# Patient Record
Sex: Male | Born: 1995
Health system: Southern US, Community
[De-identification: ages and names within clinical notes are randomized; demographics above are authoritative.]

## PROBLEM LIST (undated history)

## (undated) DIAGNOSIS — F32A Depression, unspecified: Secondary | ICD-10-CM

## (undated) DIAGNOSIS — F419 Anxiety disorder, unspecified: Secondary | ICD-10-CM

## (undated) DIAGNOSIS — J302 Other seasonal allergic rhinitis: Secondary | ICD-10-CM

## (undated) HISTORY — PX: MYRINGOTOMY: SUR874

## (undated) HISTORY — PX: WISDOM TOOTH EXTRACTION: SHX21

---
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
## 2016-12-03 ENCOUNTER — Emergency Department
Admission: EM | Admit: 2016-12-03 | Discharge: 2016-12-03 | Disposition: A | Discharge status: Home / Self Care | Payer: 59 | Source: Home / Self Care | Attending: Family Medicine | Admitting: Family Medicine

## 2016-12-03 ENCOUNTER — Encounter: Payer: Self-pay | Admitting: Emergency Medicine

## 2016-12-03 DIAGNOSIS — H938X3 Other specified disorders of ear, bilateral: Secondary | ICD-10-CM

## 2016-12-03 DIAGNOSIS — J069 Acute upper respiratory infection, unspecified: Secondary | ICD-10-CM

## 2016-12-03 DIAGNOSIS — B9789 Other viral agents as the cause of diseases classified elsewhere: Secondary | ICD-10-CM

## 2016-12-03 MED ORDER — AMOXICILLIN 500 MG PO CAPS: 500.0000 mg | ORAL_CAPSULE | Freq: Three times a day (TID) | ORAL | 0 refills | Status: DC

## 2016-12-03 NOTE — ED Provider Notes (Signed)
[facility]    CSN: [id number] Arrival date & time: 12/03/16  1433     History   Chief Complaint Chief Complaint  Patient presents with  . Sinus Problem    HPI Steven Chan is a 21 y.o. male.   HPI  Steven SavoySeth M Steven Chan is a 21 y.o. male presenting to UC with c/o 10 days of mild to moderate sinus congestion and facial pressure, popping in his ears and Right ear pain. Mild intermittent non-productive cough. His parents have been sick with similar symptoms recently. Denies fever, chills, n/v/d.    History reviewed. No pertinent past medical history.  There are no active problems to display for this patient.   Past Surgical History:  Procedure Laterality Date  . MYRINGOTOMY    . WISDOM TOOTH EXTRACTION         Home Medications    Prior to Admission medications   Medication Sig Start Date End Date Taking? Authorizing Provider  amoxicillin (AMOXIL) 500 MG capsule Take 1 capsule (500 mg total) by mouth 3 (three) times daily. 12/03/16   Lurene ShadowPhelps, Solaris Kram O, PA-C    Family History Family History  Problem Relation Age of Onset  . Multiple sclerosis Mother     Social History Social History  Substance Use Topics  . Smoking status: Never Smoker  . Smokeless tobacco: Never Used  . Alcohol use No     Allergies   Patient has no known allergies.   Review of Systems Review of Systems  Constitutional: Negative for chills and fever.  HENT: Positive for congestion, ear pain (Right worse than Left), postnasal drip, rhinorrhea and sinus pressure. Negative for sore throat, trouble swallowing and voice change.   Respiratory: Positive for cough. Negative for shortness of breath.   Cardiovascular: Negative for chest pain and palpitations.  Gastrointestinal: Negative for abdominal pain, diarrhea, nausea and vomiting.  Musculoskeletal: Negative for arthralgias, back pain and myalgias.  Skin: Negative for rash.     Physical Exam Triage Vital Signs ED Triage Vitals  [12/03/16 1448]  Enc Vitals Group     BP (!) 142/89     Pulse Rate (!) 111     Resp      Temp 97.7 F (36.5 C)     Temp Source Oral     SpO2 100 %     Weight 147 lb (66.7 kg)     Height 6\' 1"  (1.854 m)     Head Circumference      Peak Flow      Pain Score 2     Pain Loc      Pain Edu?      Excl. in GC?    No data found.   Updated Vital Signs BP (!) 142/89 (BP Location: Left Arm)   Pulse (!) 111   Temp 97.7 F (36.5 C) (Oral)   Ht 6\' 1"  (1.854 m)   Wt 147 lb (66.7 kg)   SpO2 100%   BMI 19.39 kg/m   Visual Acuity Right Eye Distance:   Left Eye Distance:   Bilateral Distance:    Right Eye Near:   Left Eye Near:    Bilateral Near:     Physical Exam  Constitutional: He is oriented to person, place, and time. He appears well-developed and well-nourished. No distress.  HENT:  Head: Normocephalic and atraumatic.  Right Ear: Tympanic membrane is not erythematous and not bulging. A middle ear effusion is present.  Left Ear: Tympanic membrane is not erythematous  and not bulging. A middle ear effusion is present.  Nose: Mucosal edema present. Right sinus exhibits no maxillary sinus tenderness and no frontal sinus tenderness. Left sinus exhibits no maxillary sinus tenderness and no frontal sinus tenderness.  Mouth/Throat: Uvula is midline, oropharynx is clear and moist and mucous membranes are normal.  Eyes: EOM are normal.  Neck: Normal range of motion. Neck supple.  Cardiovascular: Normal rate and regular rhythm.   Pulmonary/Chest: Effort normal and breath sounds normal. No stridor. No respiratory distress. He has no wheezes. He has no rales.  Musculoskeletal: Normal range of motion.  Lymphadenopathy:    He has no cervical adenopathy.  Neurological: He is alert and oriented to person, place, and time.  Skin: Skin is warm and dry. He is not diaphoretic.  Psychiatric: He has a normal mood and affect. His behavior is normal.  Nursing note and vitals reviewed.    UC  Treatments / Results  Labs (all labs ordered are listed, but only abnormal results are displayed) Labs Reviewed - No data to display  EKG  EKG Interpretation None       Radiology No results found.  Procedures Procedures (including critical care time)  Medications Ordered in UC Medications - No data to display   Initial Impression / Assessment and Plan / UC Course  I have reviewed the triage vital signs and the nursing notes.  Pertinent labs & imaging results that were available during my care of the patient were reviewed by me and considered in my medical decision making (see chart for details).     Tympanometry: Left ear- positive peak pressure   Right ear- Normal  Final Clinical Impressions(s) / UC Diagnoses   Final diagnoses:  Viral upper respiratory tract infection with cough  Ear fullness, bilateral   Hx and exam c/w viral URI, encouraged symptomatic treatment. Prescription to hold with expiration date for amoxicillin. Pt to fill if persistent fever develops, if ear pain worsens or not improving in 1 week.    New Prescriptions Discharge Medication List as of 12/03/2016  2:59 PM    START taking these medications   Details  amoxicillin (AMOXIL) 500 MG capsule Take 1 capsule (500 mg total) by mouth 3 (three) times daily., Starting Fri 12/03/2016, Print         Controlled Substance Prescriptions [place] Controlled Substance Registry consulted? Not Applicable   Rolla Plate 12/03/16 1524

## 2016-12-03 NOTE — ED Triage Notes (Signed)
Sinus pain, pressure, congestion, ears popping, rt ear pain x 10 days

## 2016-12-03 NOTE — Discharge Instructions (Signed)
°  You may take 500mg  acetaminophen every 4-6 hours or in combination with ibuprofen 400-600mg  every 6-8 hours as needed for pain, inflammation, and fever.  Be sure to drink at least eight 8oz glasses of water to stay well hydrated and get at least 8 hours of sleep at night, preferably more while sick.   Your symptoms are likely due to a virus such as the common cold, however, if you developing worsening chest congestion with shortness of breath, persistent fever (>100.4*F) for 3 days, worsening ear pain, or symptoms not improving in 4-5 days, you may fill the antibiotic (amoxicillin.  If you do fill the antibiotic,  please take antibiotics as prescribed and be sure to complete entire course even if you start to feel better to ensure infection does not come back.

## 2017-06-04 ENCOUNTER — Emergency Department
Admission: EM | Admit: 2017-06-04 | Discharge: 2017-06-04 | Discharge status: Absent without leave | Payer: 59 | Source: Home / Self Care

## 2017-08-19 ENCOUNTER — Other Ambulatory Visit: Payer: Self-pay | Admitting: Family Medicine

## 2017-08-19 DIAGNOSIS — R590 Localized enlarged lymph nodes: Secondary | ICD-10-CM

## 2017-08-25 ENCOUNTER — Ambulatory Visit
Admission: RE | Admit: 2017-08-25 | Discharge: 2017-08-25 | Disposition: A | Discharge status: Home / Self Care | Payer: 59 | Source: Ambulatory Visit | Attending: Family Medicine | Admitting: Family Medicine

## 2017-08-25 DIAGNOSIS — R590 Localized enlarged lymph nodes: Secondary | ICD-10-CM

## 2019-03-28 DIAGNOSIS — S61209A Unspecified open wound of unspecified finger without damage to nail, initial encounter: Secondary | ICD-10-CM | POA: Diagnosis not present

## 2019-03-28 DIAGNOSIS — Z23 Encounter for immunization: Secondary | ICD-10-CM | POA: Diagnosis not present

## 2019-03-28 DIAGNOSIS — L309 Dermatitis, unspecified: Secondary | ICD-10-CM | POA: Diagnosis not present

## 2019-04-16 IMAGING — US US SOFT TISSUE HEAD/NECK
1 series · 14 of 17 positions shown · non-contrast
Comparison: None.

CLINICAL DATA: Palpable posterior cervical lymph nodes

EXAM:
ULTRASOUND OF HEAD/NECK SOFT TISSUES
TECHNIQUE: Ultrasound examination of the head and neck soft tissues was
performed in the area of clinical concern.

[Series 1: us soft tissue head/neck · 0.05mm/px · 17 acquisitions, 14 frames shown]
[im 1/17]
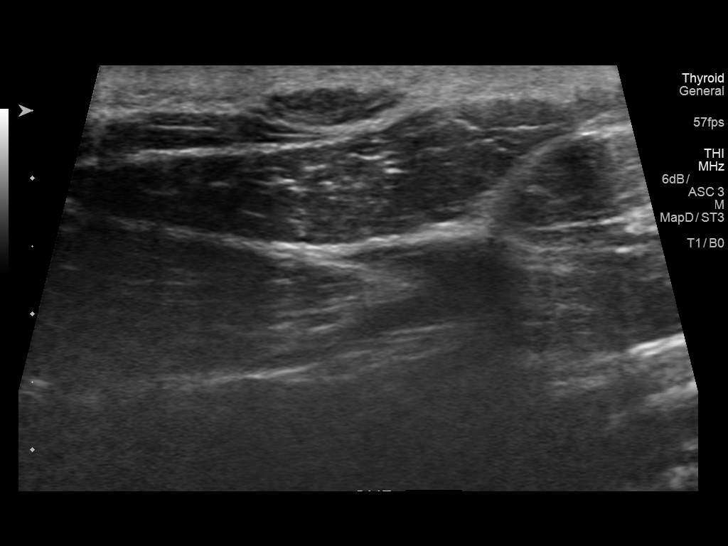
[im 2/17]
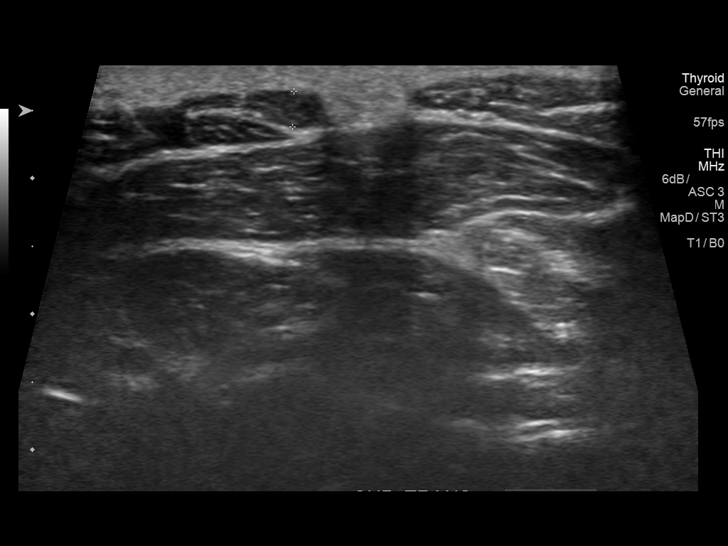
[im 4/17]
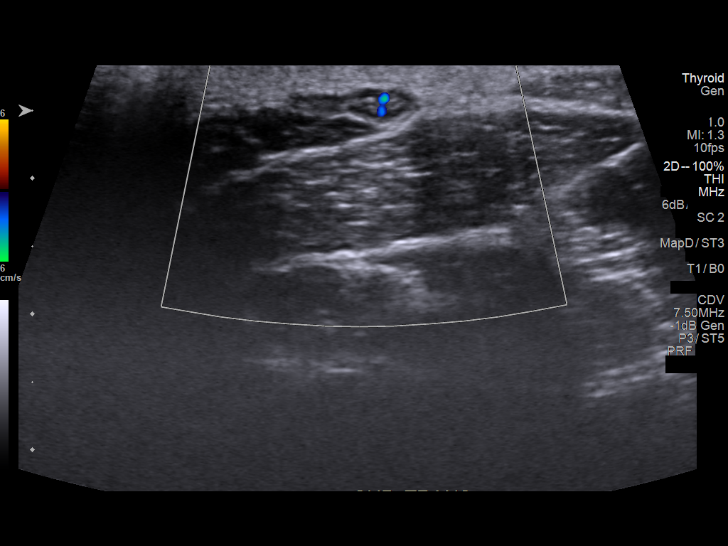
[im 5/17]
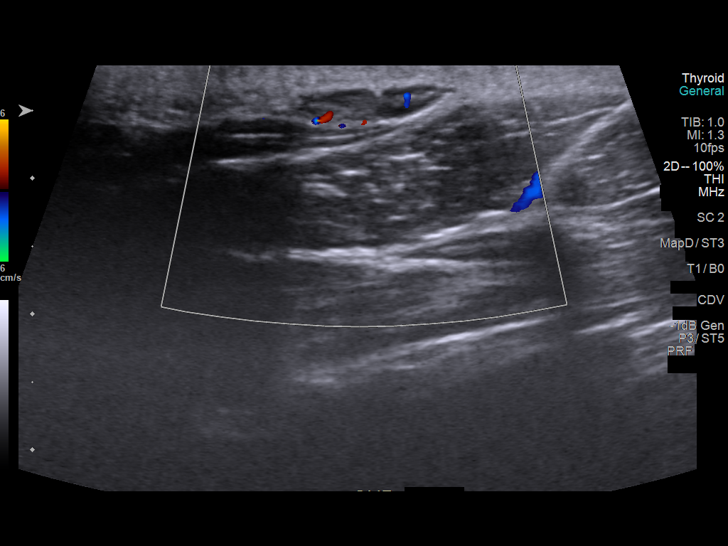
[im 6/17]
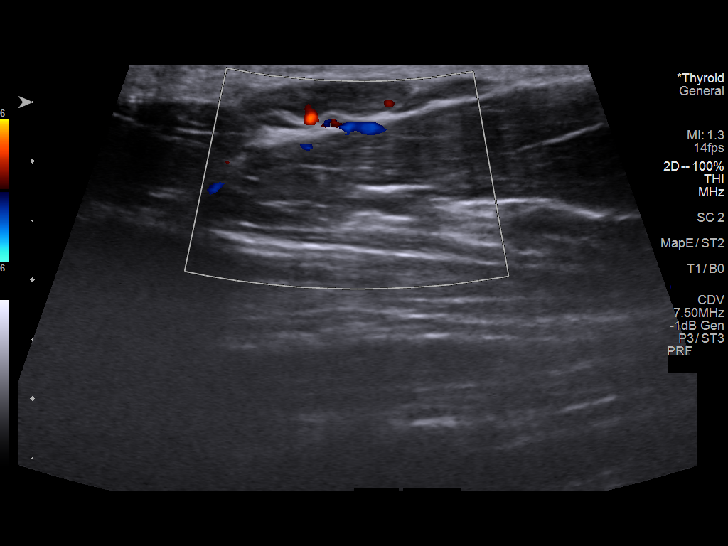
[im 7/17]
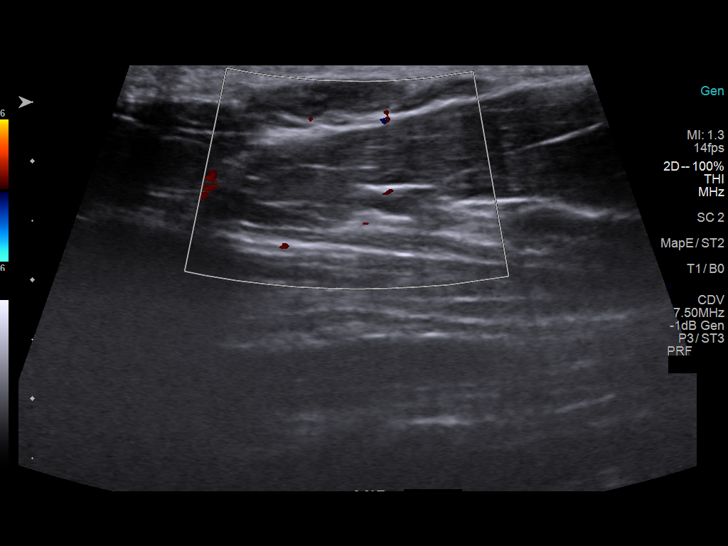
[im 8/17]
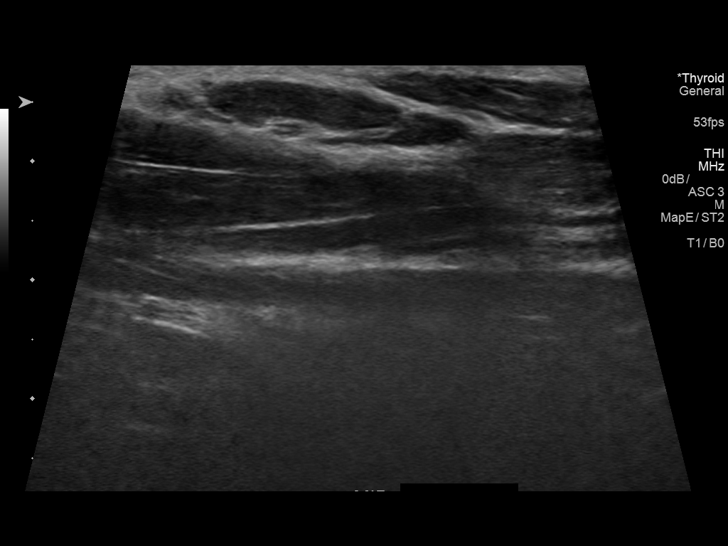
[im 10/17]
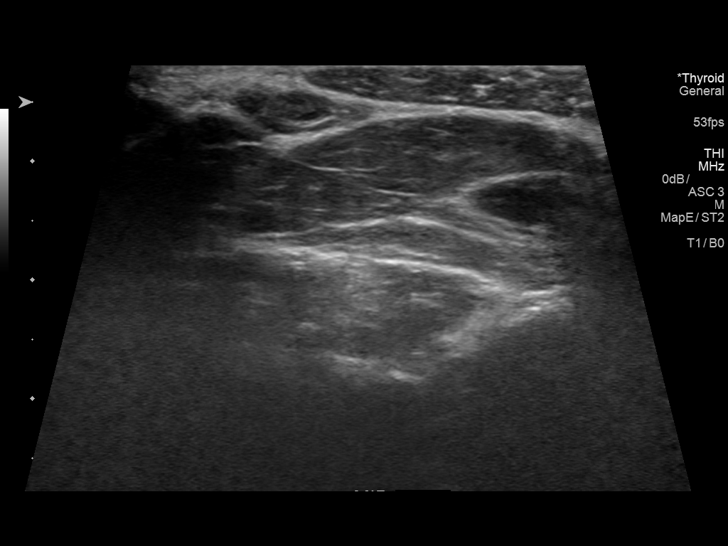
[im 11/17]
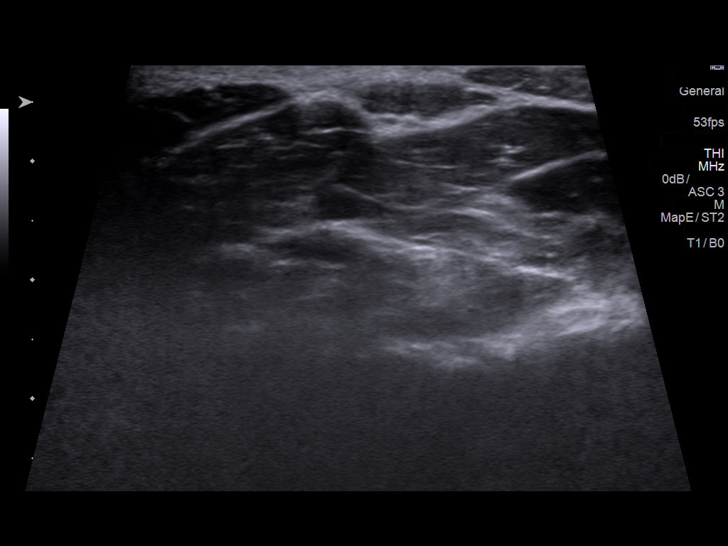
[im 12/17]
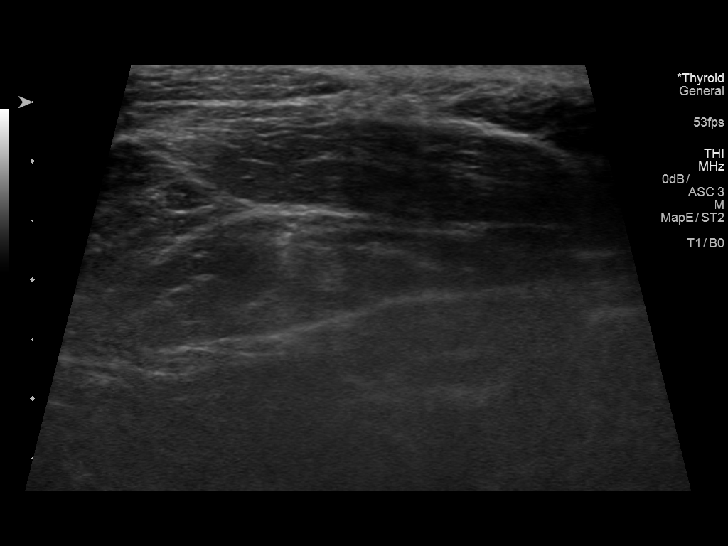
[im 13/17]
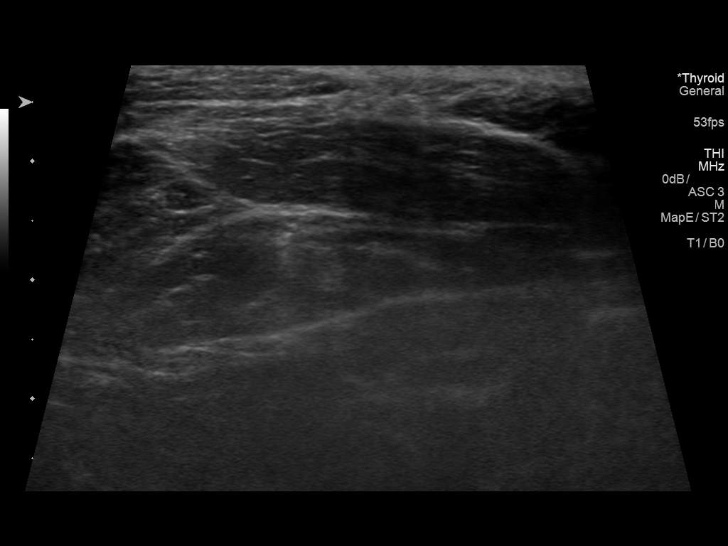
[im 14/17]
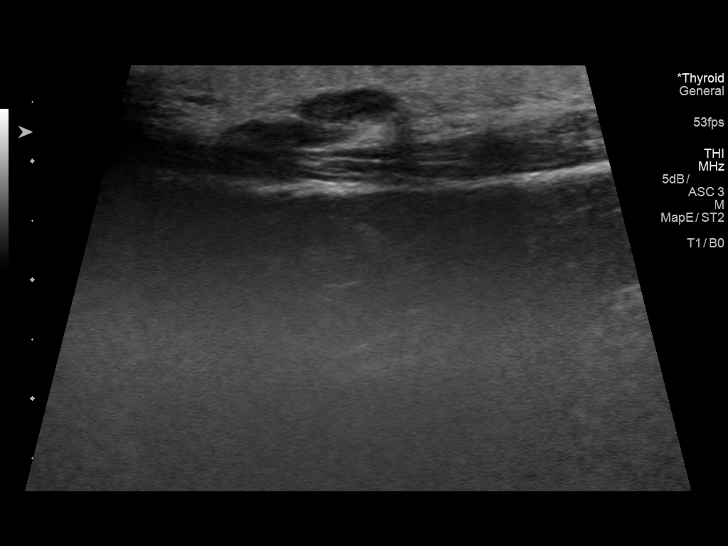
[im 16/17]
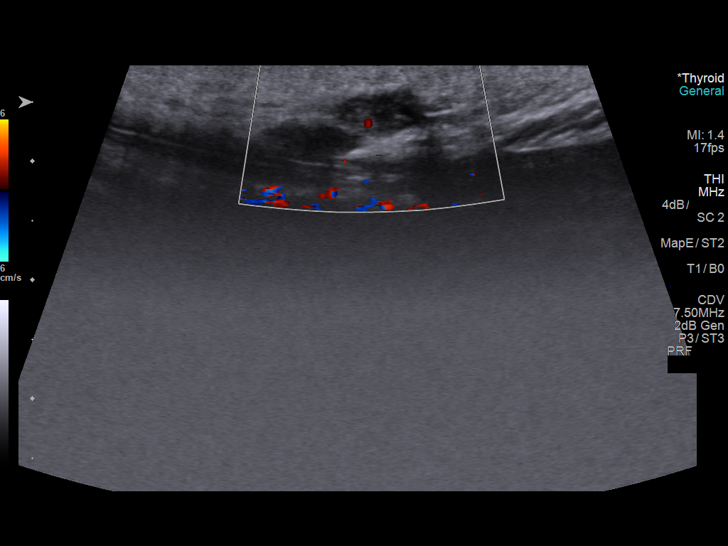
[im 17/17]
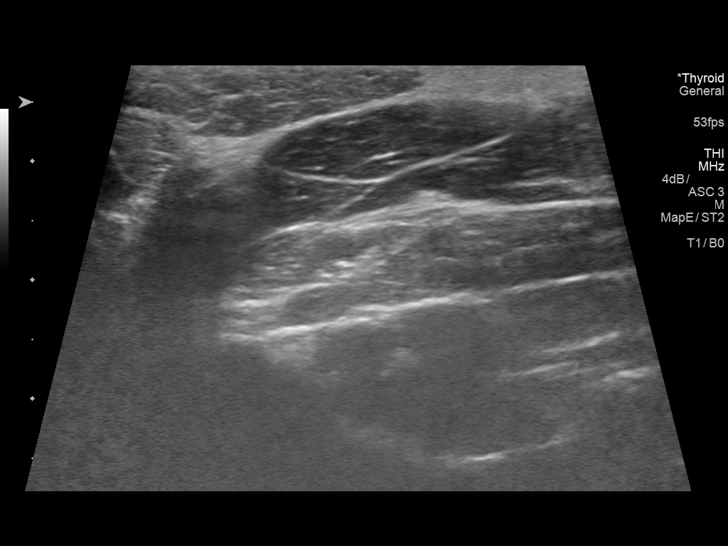

[14 of 17 positions shown; findings below may reference images not displayed]

FINDINGS: Superficial soft tissue ultrasound performed of the posterior neck
palpable lymph nodes. Palpable abnormalities correlate with small
superficial cervical lymph nodes with short axis measurements of 4
mm or less. No abnormal enlarged nodes or adenopathy appreciated. No
soft tissue fluid collection, or other abnormality.
IMPRESSION: Small palpable abnormalities correlate with benign-appearing
cervical lymph nodes.

## 2019-04-18 ENCOUNTER — Ambulatory Visit (INDEPENDENT_AMBULATORY_CARE_PROVIDER_SITE_OTHER): Payer: BC Managed Care – PPO

## 2019-04-18 ENCOUNTER — Other Ambulatory Visit: Payer: Self-pay

## 2019-04-18 ENCOUNTER — Ambulatory Visit: Payer: BC Managed Care – PPO | Admitting: Orthopaedic Surgery

## 2019-04-18 ENCOUNTER — Encounter: Payer: Self-pay | Admitting: Orthopaedic Surgery

## 2019-04-18 DIAGNOSIS — M25511 Pain in right shoulder: Secondary | ICD-10-CM | POA: Diagnosis not present

## 2019-04-18 NOTE — Progress Notes (Signed)
Office Visit Note   Patient: Steven Chan           Date of Birth: 01/28/1996           MRN: [id number] Visit Date: 04/18/2019              Requested by: Iva Boop, MD [address] [place],  [place] [address] PCP: Iva Boop, MD   Assessment & Plan: Visit Diagnoses:  1. Acute pain of right shoulder     Plan: My recommendation is for him to avoid pull-ups and push-ups for least the next 6 weeks.  I would have him wear the sling for only 2 weeks but coming in and out of sling to gently work on range of motion of his shoulder.  Right now there is nothing else that I recommend unless the shoulder comes out of place again.  At that point we would obtain an MRI arthrogram to rule out labral tear.  All questions and concerns were answered addressed.  He is comfortable otherwise.  He will follow-up as needed.  Follow-Up Instructions: Return if symptoms worsen or fail to improve.   Orders:  Orders Placed This Encounter  Procedures  . XR Shoulder Right   No orders of the defined types were placed in this encounter.     Procedures: No procedures performed   Clinical Data: No additional findings.   Subjective: Chief Complaint  Patient presents with  . Right Shoulder - Pain  The patient is a 24 year old that I am seeing for the first time.  He states that his shoulder came out of place and he was able to quickly put it back in place last evening.  He was reaching for a towel that was dropping to catch it and he moves so quick that his shoulder popped out of place.  He says that this actually happened 1 time a few years ago to his left shoulder and he never had problems after that.  He was doing a lot of push-ups for the first time in a long time before this happened.  He denies any neck pain today.  He denies any numbness and tingling in his right hand.  He is right-hand dominant.  He is a Consulting civil engineer.  He is wearing a sling today as well.  HPI  Review of Systems He currently  denies any headache, chest pain, shortness of breath, fever, chills, nausea, vomiting  Objective: Vital Signs: There were no vitals taken for this visit.  Physical Exam He is alert and orient x3 and in no acute distress Ortho Exam On examination of his right shoulder today it is clinically located.  He does have a positive apprehension sign with internal rotation adduction but also external rotation.  There is no deficits of the rotator cuff as I can tell on exam. Specialty Comments:  No specialty comments available.  Imaging: No results found.   PMFS History: There are no problems to display for this patient.  History reviewed. No pertinent past medical history.  Family History  Problem Relation Age of Onset  . Multiple sclerosis Mother     Past Surgical History:  Procedure Laterality Date  . MYRINGOTOMY    . WISDOM TOOTH EXTRACTION     Social History   Occupational History  . Not on file  Tobacco Use  . Smoking status: Never Smoker  . Smokeless tobacco: Never Used  Substance and Sexual Activity  . Alcohol use: No  . Drug use: No  .  Sexual activity: Not on file

## 2019-07-10 DIAGNOSIS — U071 COVID-19: Secondary | ICD-10-CM

## 2019-07-10 HISTORY — DX: COVID-19: U07.1

## 2019-07-25 DIAGNOSIS — Z13220 Encounter for screening for lipoid disorders: Secondary | ICD-10-CM | POA: Diagnosis not present

## 2019-07-25 DIAGNOSIS — Z0000 Encounter for general adult medical examination without abnormal findings: Secondary | ICD-10-CM | POA: Diagnosis not present

## 2019-07-30 DIAGNOSIS — F411 Generalized anxiety disorder: Secondary | ICD-10-CM | POA: Diagnosis not present

## 2019-07-30 DIAGNOSIS — E559 Vitamin D deficiency, unspecified: Secondary | ICD-10-CM | POA: Diagnosis not present

## 2019-07-30 DIAGNOSIS — Z0000 Encounter for general adult medical examination without abnormal findings: Secondary | ICD-10-CM | POA: Diagnosis not present

## 2019-09-06 DIAGNOSIS — D2271 Melanocytic nevi of right lower limb, including hip: Secondary | ICD-10-CM | POA: Diagnosis not present

## 2019-09-06 DIAGNOSIS — L700 Acne vulgaris: Secondary | ICD-10-CM | POA: Diagnosis not present

## 2019-09-06 DIAGNOSIS — D225 Melanocytic nevi of trunk: Secondary | ICD-10-CM | POA: Diagnosis not present

## 2019-09-06 DIAGNOSIS — D485 Neoplasm of uncertain behavior of skin: Secondary | ICD-10-CM | POA: Diagnosis not present

## 2019-09-06 DIAGNOSIS — D2262 Melanocytic nevi of left upper limb, including shoulder: Secondary | ICD-10-CM | POA: Diagnosis not present

## 2019-11-27 DIAGNOSIS — Z23 Encounter for immunization: Secondary | ICD-10-CM | POA: Diagnosis not present

## 2019-12-13 DIAGNOSIS — L700 Acne vulgaris: Secondary | ICD-10-CM | POA: Diagnosis not present

## 2019-12-13 DIAGNOSIS — D225 Melanocytic nevi of trunk: Secondary | ICD-10-CM | POA: Diagnosis not present

## 2020-02-20 DIAGNOSIS — Z1152 Encounter for screening for COVID-19: Secondary | ICD-10-CM | POA: Diagnosis not present

## 2020-06-27 DIAGNOSIS — J309 Allergic rhinitis, unspecified: Secondary | ICD-10-CM | POA: Diagnosis not present

## 2020-08-25 DIAGNOSIS — E559 Vitamin D deficiency, unspecified: Secondary | ICD-10-CM | POA: Diagnosis not present

## 2020-08-25 DIAGNOSIS — Z0000 Encounter for general adult medical examination without abnormal findings: Secondary | ICD-10-CM | POA: Diagnosis not present

## 2020-09-01 DIAGNOSIS — Z0000 Encounter for general adult medical examination without abnormal findings: Secondary | ICD-10-CM | POA: Diagnosis not present

## 2020-11-15 DIAGNOSIS — Z23 Encounter for immunization: Secondary | ICD-10-CM | POA: Diagnosis not present

## 2021-04-07 ENCOUNTER — Ambulatory Visit (INDEPENDENT_AMBULATORY_CARE_PROVIDER_SITE_OTHER): Payer: 59 | Admitting: Orthopaedic Surgery

## 2021-04-07 ENCOUNTER — Encounter: Payer: Self-pay | Admitting: Orthopaedic Surgery

## 2021-04-07 ENCOUNTER — Ambulatory Visit: Payer: Self-pay

## 2021-04-07 DIAGNOSIS — M25512 Pain in left shoulder: Secondary | ICD-10-CM | POA: Diagnosis not present
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
## 2021-08-31 ENCOUNTER — Ambulatory Visit (INDEPENDENT_AMBULATORY_CARE_PROVIDER_SITE_OTHER): Payer: 59

## 2021-08-31 ENCOUNTER — Ambulatory Visit (INDEPENDENT_AMBULATORY_CARE_PROVIDER_SITE_OTHER): Payer: 59 | Admitting: Orthopedic Surgery

## 2021-08-31 DIAGNOSIS — M25511 Pain in right shoulder: Secondary | ICD-10-CM | POA: Diagnosis not present

## 2021-08-31 DIAGNOSIS — M25311 Other instability, right shoulder: Secondary | ICD-10-CM

## 2021-09-02 ENCOUNTER — Encounter: Payer: Self-pay | Admitting: Rehabilitative and Restorative Service Providers"

## 2021-09-02 ENCOUNTER — Ambulatory Visit: Payer: 59 | Attending: Orthopedic Surgery | Admitting: Rehabilitative and Restorative Service Providers"

## 2021-09-02 ENCOUNTER — Other Ambulatory Visit: Payer: Self-pay

## 2021-09-02 DIAGNOSIS — G2589 Other specified extrapyramidal and movement disorders: Secondary | ICD-10-CM | POA: Insufficient documentation

## 2021-09-02 DIAGNOSIS — R29898 Other symptoms and signs involving the musculoskeletal system: Secondary | ICD-10-CM | POA: Insufficient documentation

## 2021-09-02 DIAGNOSIS — M25511 Pain in right shoulder: Secondary | ICD-10-CM | POA: Diagnosis not present

## 2021-09-02 DIAGNOSIS — M6281 Muscle weakness (generalized): Secondary | ICD-10-CM | POA: Diagnosis not present
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
## 2021-09-05 ENCOUNTER — Encounter: Payer: Self-pay | Admitting: Orthopedic Surgery
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
## 2021-09-07 ENCOUNTER — Ambulatory Visit: Payer: 59 | Admitting: Rehabilitative and Restorative Service Providers"

## 2021-09-07 ENCOUNTER — Encounter: Payer: Self-pay | Admitting: Rehabilitative and Restorative Service Providers"

## 2021-09-07 DIAGNOSIS — M25511 Pain in right shoulder: Secondary | ICD-10-CM | POA: Diagnosis not present

## 2021-09-07 DIAGNOSIS — G2589 Other specified extrapyramidal and movement disorders: Secondary | ICD-10-CM

## 2021-09-07 DIAGNOSIS — M6281 Muscle weakness (generalized): Secondary | ICD-10-CM

## 2021-09-07 DIAGNOSIS — R29898 Other symptoms and signs involving the musculoskeletal system: Secondary | ICD-10-CM
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
## 2021-09-09 ENCOUNTER — Ambulatory Visit: Payer: 59 | Attending: Orthopedic Surgery | Admitting: Rehabilitative and Restorative Service Providers"

## 2021-09-09 ENCOUNTER — Other Ambulatory Visit: Payer: Self-pay

## 2021-09-09 ENCOUNTER — Encounter: Payer: Self-pay | Admitting: Rehabilitative and Restorative Service Providers"

## 2021-09-09 DIAGNOSIS — R29898 Other symptoms and signs involving the musculoskeletal system: Secondary | ICD-10-CM

## 2021-09-09 DIAGNOSIS — G2589 Other specified extrapyramidal and movement disorders: Secondary | ICD-10-CM | POA: Diagnosis present

## 2021-09-09 DIAGNOSIS — M6281 Muscle weakness (generalized): Secondary | ICD-10-CM

## 2021-09-09 DIAGNOSIS — M25511 Pain in right shoulder: Secondary | ICD-10-CM | POA: Diagnosis present

## 2021-09-09 NOTE — Therapy (Signed)
OUTPATIENT PHYSICAL THERAPY CERVICAL EVALUATION   Patient Name: Steven Chan MRN: [id number] DOB:12/23/1995, 26 y.o., male Today's Date: 09/09/2021   PT End of Session - 09/09/21 0713     Visit Number 3    Number of Visits 12    Date for PT Re-Evaluation 10/14/21    PT Start Time 0712    PT Stop Time 0802    PT Time Calculation (min) 50 min             History reviewed. No pertinent past medical history. Past Surgical History:  Procedure Laterality Date   MYRINGOTOMY     WISDOM TOOTH EXTRACTION     There are no problems to display for this patient.   PCP: Dr Caryn Bee Via  REFERRING PROVIDER: Dr Cammy Copa  REFERRING DIAG: Acute Rt shoulder pain   THERAPY DIAG:  Acute pain of right shoulder  Scapular dyskinesis  Other symptoms and signs involving the musculoskeletal system  Muscle weakness (generalized)  Rationale for Evaluation and Treatment Rehabilitation  ONSET DATE: 07/09/21  SUBJECTIVE:                                                                                                                                                                                                         SUBJECTIVE STATEMENT: Patient reports that he has been working on his exercises. His Rt shoulder is a little achy this morning - may have slept on it wrong last night.   PERTINENT HISTORY:  Denies any other medical problems   PAIN:  Are you having pain? Yes 1/10 Rt shoulder  Aching, not sharp    PRECAUTIONS: None  WEIGHT BEARING RESTRICTIONS No  FALLS:  Has patient fallen in last 6 months? 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 Awkward lifting up to 350 lb for 2 person lift. Has been at this job 1.5 yrs. He has started riding motorcycle recently. Some yard work. Otherwise  sedentary - sitting in soft sofa  PLOF: Independent  PATIENT GOALS strengthen shoulders and avoid recurrent dislocation   OBJECTIVE:   DIAGNOSTIC FINDINGS:  Xrays - negative   PATIENT SURVEYS:  FOTO 52   COGNITION: Overall cognitive status: Within functional limits for tasks assessed   SENSATION: WFL's per pt report   POSTURE: head forward; shoulders rounded and elevated; head of the humerus anterior in orientation; scapulae abducted and rotated along the thoracic wall;  increased thoracic kyphosis  PALPATION: Muscular tightness pecs; upper trap; leveator; teres bilat    CERVICAL ROM:   Active ROM A/PROM (deg) eval  Flexion 53  Extension 57  Right lateral flexion 28  Left lateral flexion 32  Right rotation 67  Left rotation 68   (Blank rows = not tested)  UPPER EXTREMITY ROM:  Active ROM Right eval Left eval  Shoulder flexion 175 166  Shoulder extension 57 59  Shoulder abduction 157 180  Shoulder adduction        Shoulder internal rotation Thumb to T7 Thumb to T5  Shoulder external rotation 112 shoulder 90 deg/elbow 90 deg  100 shoulder 90 deg/elbow 90 deg                                     (Blank rows = not tested)  UPPER EXTREMITY MMT:  MMT Right eval Left eval  Shoulder flexion 5/5 5/5  Shoulder extension 5/5 5/5  Shoulder abduction 5/5 5/5  Shoulder adduction 5/5 5/5  Shoulder extension 5/5 5/5  Shoulder internal rotation 5/5 5/5  Shoulder external rotation 5/5 5/5  Middle trapezius 4/5 4/5  Lower trapezius 4/5 4/5                                       (Blank rows = not tested)    TODAY'S TREATMENT:  Postural correction/education Education re-office ergonomics Sitting posture using noodle                      Chin tuck 10 sec x 5 Chest lift 10 sec x 10 L's x 10 W's x 10 Doorway stretch 3 positions 30 sec x 3 reps  Upper trap stretch standing 10 sec hold x 2 each side  Shoulder flexion stretch stepping through the  doorway 30 sec x 2 reps Prone scap squeeze 5-10 sec hold x 10 reps  ER red TB x 10 W's red TB x 1o Row blue TB x 10 Bow and arrow blue TB x 10 Counter plank 1 min x 3 reps  Myofacial ball release standing   Manual work: Dry needling - consent yes Information given  DN to pecs, upper trap  Twitch response, decreased palpable tightness  Tolerated     PATIENT EDUCATION:  Education details: POC HEP posture Person educated: Patient Education method: Programmer, multimedia, Facilities manager, Actor cues, Verbal cues, and Handouts Education comprehension: verbalized understanding, returned demonstration, verbal cues required, tactile cues required, and needs further education   HOME EXERCISE PROGRAM: Access Code: [email or web address] URL: [email or web address] Date: 09/09/2021 Prepared by: Corlis Leak  Exercises - Seated Cervical Retraction  - 3 x daily - 7 x weekly - 1 sets - 10 reps - Standing Scapular Retraction  - 3 x daily - 7 x weekly - 1 sets - 10 reps - 10 hold - Shoulder External Rotation and Scapular Retraction  - 3 x daily - 7 x weekly - 1 sets - 10 reps -   hold - Shoulder External Rotation in 45 Degrees Abduction  - 2 x daily - 7 x weekly - 1-2 sets - 10 reps - 3 sec  hold - Doorway Pec Stretch at 60 Degrees Abduction  - 3 x daily - 7 x weekly - 1 sets - 3 reps - Doorway Pec Stretch at 90  Degrees Abduction  - 3 x daily - 7 x weekly - 1 sets - 3 reps - 30 seconds  hold - Doorway Pec Stretch at 120 Degrees Abduction  - 3 x daily - 7 x weekly - 1 sets - 3 reps - 30 second hold  hold - Standing Shoulder External Rotation with Resistance  - 2 x daily - 7 x weekly - 1-3 sets - 10 reps - 2-3 sec  hold - Shoulder W - External Rotation with Resistance  - 2 x daily - 7 x weekly - 1-2 sets - 10 reps - 3 sec  hold - Standing Bilateral Low Shoulder Row with Anchored Resistance  - 2 x daily - 7 x weekly - 1-3 sets - 10 reps - 2-3 sec  hold - Drawing Bow  - 1 x daily - 7 x weekly - 1 sets - 10  reps - 3 sec  hold - Plank with Hands on Table  - 2 x daily - 7 x weekly - 1 sets - 3 reps - 60 sec  hold - Supine Chest Stretch on Foam Roll  - 2 x daily - 7 x weekly - 1 sets - 1 reps - 2-5 min  sec  hold - Standing Shoulder Flexion Wall Walk  - 2 x daily - 7 x weekly - 1 sets - 3 reps - 30 sec  hold - Prone Scapular Retraction  - 2 x daily - 7 x weekly - 1 sets - 5-10 reps - 3-5 sec  hold - Standing Upper Trapezius Stretch  - 2 x daily - 7 x weekly - 1 sets - 3 reps - 10 sec  hold      ASSESSMENT:  CLINICAL IMPRESSION: Added strengthening exercises without difficulty. Minimal pain. Will continue to work on strength and stability for thoracic spine and shoulder girdle. Trial of DN for Rt pecs and upper trap    OBJECTIVE IMPAIRMENTS decreased activity tolerance, decreased ROM, decreased strength, hypomobility, increased fascial restrictions, impaired flexibility, impaired UE functional use, improper body mechanics, postural dysfunction, and pain.   ACTIVITY LIMITATIONS carrying, lifting, and reach over head  PARTICIPATION LIMITATIONS: occupation and yard work  PERSONAL FACTORS Fitness, Past/current experiences, and history of multiple subluxations/dislocations  are also affecting patient's functional outcome.   REHAB POTENTIAL: Good  CLINICAL DECISION MAKING: Stable/uncomplicated  EVALUATION COMPLEXITY: Low   GOALS: Goals reviewed with patient? Yes  LONG TERM GOALS: Target date: 10/21/2021   Improve posture and alignment with patient to demonstrate improve upright posture with posterior shoulder girdle engaged - 5/5 strength middle and lower traps bilat   Goal status: INITIAL  2.  Full pain free shoulder AROM  Goal status: INITIAL  3.  Patient reports confidence to return to all normal functional activities using Rt UE   Goal status: INITIAL  4.  Independent in HEP   Goal status: INITIAL  5.  Improve functional assessment score   Goal status:  INITIAL     PLAN: PT FREQUENCY: 2x/week  PT DURATION: 6 weeks  PLANNED INTERVENTIONS: Therapeutic exercises, Therapeutic activity, Neuromuscular re-education, Balance training, Gait training, Patient/Family education, Self Care, Joint mobilization, Dry Needling, Electrical stimulation, Cryotherapy, Moist heat, Taping, and Re-evaluation  PLAN FOR NEXT SESSION: Review and progress HEP; continue with postural correction and ergonomic eduction; manual work vs DN as indicated; add prolonged pec stretch and posterior shoulder girdle strengthening; modalities as indicate. Assess response to DN and manual work    Dezhane Staten Rober Minion, PT, MPH  09/09/2021, 7:57 AM     [facility] [address] [place], [place], [address] Phone: [phone]   Fax:  [phone]  Physical Therapy Treatment  Patient Details  Name: Steven Chan MRN: [id number] Date of Birth: 1995-06-14 No data recorded  Encounter Date: 09/09/2021   PT End of Session - 09/09/21 0713     Visit Number 3    Number of Visits 12    Date for PT Re-Evaluation 10/14/21    PT Start Time 0712    PT Stop Time 0802    PT Time Calculation (min) 50 min             History reviewed. No pertinent past medical history.  Past Surgical History:  Procedure Laterality Date   MYRINGOTOMY     WISDOM TOOTH EXTRACTION      There were no vitals filed for this visit.                                            Patient will benefit from skilled therapeutic intervention in order to improve the following deficits and impairments:     Visit Diagnosis: Acute pain of right shoulder  Scapular dyskinesis  Other symptoms and signs involving the musculoskeletal system  Muscle weakness (generalized)     Problem List There are no problems to display for this patient.   Val Riles, PT 09/09/2021, 7:57 AM  [facility] [address] [place] [address] [place], [place], [address] Phone: [phone]   Fax:  [phone]  Name: Steven Chan MRN: [id number] Date of Birth: 05/21/1995

## 2021-09-14 ENCOUNTER — Ambulatory Visit: Payer: 59 | Admitting: Rehabilitative and Restorative Service Providers"

## 2021-09-16 ENCOUNTER — Ambulatory Visit: Payer: 59 | Admitting: Rehabilitative and Restorative Service Providers"

## 2021-09-16 ENCOUNTER — Encounter: Payer: Self-pay | Admitting: Rehabilitative and Restorative Service Providers"

## 2021-09-16 DIAGNOSIS — M25511 Pain in right shoulder: Secondary | ICD-10-CM

## 2021-09-16 DIAGNOSIS — G2589 Other specified extrapyramidal and movement disorders: Secondary | ICD-10-CM

## 2021-09-16 DIAGNOSIS — M6281 Muscle weakness (generalized): Secondary | ICD-10-CM

## 2021-09-16 DIAGNOSIS — R29898 Other symptoms and signs involving the musculoskeletal system: Secondary | ICD-10-CM

## 2021-09-16 NOTE — Therapy (Signed)
OUTPATIENT PHYSICAL THERAPY CERVICAL EVALUATION   Patient Name: Steven Chan MRN: [id number] DOB:1995/09/13, 26 y.o., male Today's Date: 09/16/2021   PT End of Session - 09/16/21 0720     Visit Number 4    Number of Visits 12    Date for PT Re-Evaluation 10/14/21    PT Start Time 0718    PT Stop Time 0806    PT Time Calculation (min) 48 min    Activity Tolerance Patient tolerated treatment well             History reviewed. No pertinent past medical history. Past Surgical History:  Procedure Laterality Date   MYRINGOTOMY     WISDOM TOOTH EXTRACTION     There are no problems to display for this patient.   PCP: Dr Caryn Bee Via  REFERRING PROVIDER: Dr Cammy Copa  REFERRING DIAG: Acute Rt shoulder pain   THERAPY DIAG:  Acute pain of right shoulder  Scapular dyskinesis  Other symptoms and signs involving the musculoskeletal system  Muscle weakness (generalized)  Rationale for Evaluation and Treatment Rehabilitation  ONSET DATE: 07/09/21  SUBJECTIVE:                                                                                                                                                                                                         SUBJECTIVE STATEMENT: Patient reports that he can tell the dry needling helped. The Rt upper trap feels loser. He is not having the pain he did have - only has a little pain if he lays on the Rt side.   PERTINENT HISTORY:  Denies any other medical problems   PAIN:  Are you having pain? Yes 0/10 Rt shoulder  Aching, not sharp    PRECAUTIONS: None  WEIGHT BEARING RESTRICTIONS No  FALLS:  Has patient fallen in last 6 months? 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 Awkward lifting up to 350 lb for 2 person lift. Has been  at this job 1.5 yrs. He has started riding motorcycle recently. Some yard work. Otherwise sedentary - sitting in soft sofa  PLOF: Independent  PATIENT GOALS strengthen shoulders and avoid recurrent dislocation   OBJECTIVE:   DIAGNOSTIC FINDINGS:  Xrays - negative   PATIENT SURVEYS:  FOTO 52   COGNITION: Overall cognitive status: Within functional limits for tasks assessed   SENSATION: WFL's per pt report   POSTURE: head forward;  shoulders rounded and elevated; head of the humerus anterior in orientation; scapulae abducted and rotated along the thoracic wall; increased thoracic kyphosis  PALPATION: Muscular tightness pecs; upper trap; leveator; teres bilat    CERVICAL ROM:   Active ROM A/PROM (deg) eval  Flexion 53  Extension 57  Right lateral flexion 28  Left lateral flexion 32  Right rotation 67  Left rotation 68   (Blank rows = not tested)  UPPER EXTREMITY ROM:  Active ROM Right eval Left eval  Shoulder flexion 175 166  Shoulder extension 57 59  Shoulder abduction 157 180  Shoulder adduction        Shoulder internal rotation Thumb to T7 Thumb to T5  Shoulder external rotation 112 shoulder 90 deg/elbow 90 deg  100 shoulder 90 deg/elbow 90 deg                                     (Blank rows = not tested)  UPPER EXTREMITY MMT:  MMT Right eval Left eval  Shoulder flexion 5/5 5/5  Shoulder extension 5/5 5/5  Shoulder abduction 5/5 5/5  Shoulder adduction 5/5 5/5  Shoulder extension 5/5 5/5  Shoulder internal rotation 5/5 5/5  Shoulder external rotation 5/5 5/5  Middle trapezius 4/5 4/5  Lower trapezius 4/5 4/5                                       (Blank rows = not tested)    TODAY'S TREATMENT:  Postural correction/education Education re-office ergonomics Sitting posture using noodle                      Chin tuck 10 sec x 5 HEP Chest lift 10 sec x 10 HEP L's x 10 HEP W's x 10 HEP Doorway stretch 3 positions 30 sec x 3 reps   Upper trap stretch standing 10 sec hold x 2 each side  Shoulder flexion stretch stepping through the doorway 30 sec x 2 reps Prone scap squeeze 5-10 sec hold x 10 reps  ER red TB x 10 W's red TB x 1o Row blue TB x 10 Bow and arrow blue TB x 10 High row with ER and press up yellow TB x 15 reps  Antirotation blue TB x 10 each side  Counter plank 1 min x 3 reps  Myofacial ball release standing HEP  Manual work: Dry needling - consent previously given DN to pecs, upper trap  E-stim mAmp current x 5 min  Twitch response, decreased palpable tightness  Tolerated well     PATIENT EDUCATION:  Education details: POC HEP posture Person educated: Patient Education method: Consulting civil engineer, Media planner, Corporate treasurer cues, Verbal cues, and Handouts Education comprehension: verbalized understanding, returned demonstration, verbal cues required, tactile cues required, and needs further education   HOME EXERCISE PROGRAM:  Access Code: [email or web address] URL: [email or web address] Date: 09/16/2021 Prepared by: Gillermo Murdoch  Exercises - Seated Cervical Retraction  - 3 x daily - 7 x weekly - 1 sets - 10 reps - Standing Scapular Retraction  - 3 x daily - 7 x weekly - 1 sets - 10 reps - 10 hold - Shoulder External Rotation and Scapular Retraction  - 3 x daily - 7 x weekly - 1 sets - 10 reps -   hold - Shoulder External Rotation in 45  Degrees Abduction  - 2 x daily - 7 x weekly - 1-2 sets - 10 reps - 3 sec  hold - Doorway Pec Stretch at 60 Degrees Abduction  - 3 x daily - 7 x weekly - 1 sets - 3 reps - Doorway Pec Stretch at 90 Degrees Abduction  - 3 x daily - 7 x weekly - 1 sets - 3 reps - 30 seconds  hold - Doorway Pec Stretch at 120 Degrees Abduction  - 3 x daily - 7 x weekly - 1 sets - 3 reps - 30 second hold  hold - Standing Shoulder External Rotation with Resistance  - 2 x daily - 7 x weekly - 1-3 sets - 10 reps - 2-3 sec  hold - Shoulder W - External Rotation with Resistance  - 2 x daily - 7  x weekly - 1-2 sets - 10 reps - 3 sec  hold - Standing Bilateral Low Shoulder Row with Anchored Resistance  - 2 x daily - 7 x weekly - 1-3 sets - 10 reps - 2-3 sec  hold - Drawing Bow  - 1 x daily - 7 x weekly - 1 sets - 10 reps - 3 sec  hold - Plank with Hands on Table  - 2 x daily - 7 x weekly - 1 sets - 3 reps - 60 sec  hold - Supine Chest Stretch on Foam Roll  - 2 x daily - 7 x weekly - 1 sets - 1 reps - 2-5 min  sec  hold - Standing Shoulder Flexion Wall Walk  - 2 x daily - 7 x weekly - 1 sets - 3 reps - 30 sec  hold - Prone Scapular Retraction  - 2 x daily - 7 x weekly - 1 sets - 5-10 reps - 3-5 sec  hold - Standing Upper Trapezius Stretch  - 2 x daily - 7 x weekly - 1 sets - 3 reps - 10 sec  hold - High Row with External Rotation and Overhead Press with Resistance Band  - 1 x daily - 7 x weekly - 1-3 sets - 10 reps - 3 sec  hold - Anti-Rotation Lateral Stepping with Press  - 2 x daily - 7 x weekly - 1-2 sets - 10 reps - 2-3 sec  hold     ASSESSMENT:  CLINICAL IMPRESSION: Progressing well with shoulder rehab. Improving with less pain and tightness reported. Postural improvement noted in standing and with exercises. Added strengthening exercises without difficulty. No pain. Will continue to work on strength and stability for thoracic spine and shoulder girdle. Continue DN for Rt pecs and upper trap as indicated.    OBJECTIVE IMPAIRMENTS decreased activity tolerance, decreased ROM, decreased strength, hypomobility, increased fascial restrictions, impaired flexibility, impaired UE functional use, improper body mechanics, postural dysfunction, and pain.   ACTIVITY LIMITATIONS carrying, lifting, and reach over head  PARTICIPATION LIMITATIONS: occupation and yard work  PERSONAL FACTORS Fitness, Past/current experiences, and history of multiple subluxations/dislocations  are also affecting patient's functional outcome.   REHAB POTENTIAL: Good  CLINICAL DECISION MAKING:  Stable/uncomplicated  EVALUATION COMPLEXITY: Low   GOALS: Goals reviewed with patient? Yes  LONG TERM GOALS: Target date: 10/28/2021   Improve posture and alignment with patient to demonstrate improve upright posture with posterior shoulder girdle engaged - 5/5 strength middle and lower traps bilat   Goal status: INITIAL  2.  Full pain free shoulder AROM  Goal status: INITIAL  3.  Patient reports confidence  to return to all normal functional activities using Rt UE   Goal status: INITIAL  4.  Independent in HEP   Goal status: INITIAL  5.  Improve functional assessment score   Goal status: INITIAL     PLAN: PT FREQUENCY: 2x/week  PT DURATION: 6 weeks  PLANNED INTERVENTIONS: Therapeutic exercises, Therapeutic activity, Neuromuscular re-education, Balance training, Gait training, Patient/Family education, Self Care, Joint mobilization, Dry Needling, Electrical stimulation, Cryotherapy, Moist heat, Taping, and Re-evaluation  PLAN FOR NEXT SESSION: Review and progress HEP; continue with postural correction and ergonomic eduction; manual work vs DN as indicated; add prolonged pec stretch and posterior shoulder girdle strengthening; modalities as indicate. Continue DN and manual work as indicated   Velicia Dejager Rober Minion, PT, MPH  09/16/2021, 7:53 AM     [facility] [address] [place], [place], [address] Phone: [phone]   Fax:  [phone]  Physical Therapy Treatment  Patient Details  Name: Steven Chan MRN: [id number] Date of Birth: [birth year] No data recorded  Encounter Date: 09/16/2021   PT End of Session - 09/16/21 0720     Visit Number 4    Number of Visits 12    Date for PT Re-Evaluation 10/14/21    PT Start Time 0718    PT Stop Time 0806    PT Time Calculation (min) 48 min    Activity Tolerance Patient tolerated treatment well             History reviewed. No pertinent past medical  history.  Past Surgical History:  Procedure Laterality Date   MYRINGOTOMY     WISDOM TOOTH EXTRACTION      There were no vitals filed for this visit.                                            Patient will benefit from skilled therapeutic intervention in order to improve the following deficits and impairments:     Visit Diagnosis: Acute pain of right shoulder  Scapular dyskinesis  Other symptoms and signs involving the musculoskeletal system  Muscle weakness (generalized)     Problem List There are no problems to display for this patient.   Val Riles, PT, MPH  09/16/2021, 7:53 AM  [facility] [address] [place], [place], [address] Phone: [phone]   Fax:  [phone]  Name: Steven Chan MRN: [id number] Date of Birth: [birth year]  [facility] [place] [address] [place], [place], [address] Phone: [phone]   Fax:  [phone]  Physical Therapy Treatment  Patient Details  Name: Steven Chan MRN: [id number] Date of Birth: 04-21-1995 No data recorded  Encounter Date: 09/16/2021   PT End of Session - 09/16/21 0720     Visit Number 4    Number of Visits 12    Date for PT Re-Evaluation 10/14/21    PT Start Time 0718    PT Stop Time 0806    PT Time Calculation (min) 48 min    Activity Tolerance Patient tolerated treatment well             History reviewed. No pertinent past medical history.  Past Surgical History:  Procedure Laterality Date   MYRINGOTOMY     WISDOM TOOTH EXTRACTION      There were no vitals filed for this  visit.                                            Patient will benefit from skilled therapeutic intervention in order to improve the following deficits and impairments:     Visit Diagnosis: Acute pain of right shoulder  Scapular  dyskinesis  Other symptoms and signs involving the musculoskeletal system  Muscle weakness (generalized)     Problem List There are no problems to display for this patient.   Everardo All, PT 09/16/2021, 7:53 AM  [facility] [address] [place], [place], [address] Phone: [phone]   Fax:  [phone]  Name: Steven Chan MRN: [id number] Date of Birth: 06/17/1995

## 2021-09-21 ENCOUNTER — Ambulatory Visit: Payer: 59 | Admitting: Rehabilitative and Restorative Service Providers"

## 2021-09-23 ENCOUNTER — Encounter: Payer: Self-pay | Admitting: Rehabilitative and Restorative Service Providers"

## 2021-09-23 ENCOUNTER — Other Ambulatory Visit: Payer: Self-pay

## 2021-09-23 ENCOUNTER — Ambulatory Visit: Payer: 59 | Admitting: Rehabilitative and Restorative Service Providers"

## 2021-09-23 ENCOUNTER — Ambulatory Visit
Admission: RE | Admit: 2021-09-23 | Discharge: 2021-09-23 | Disposition: A | Discharge status: Home / Self Care | Payer: 59 | Source: Ambulatory Visit | Attending: Orthopedic Surgery | Admitting: Orthopedic Surgery

## 2021-09-23 DIAGNOSIS — M25511 Pain in right shoulder: Secondary | ICD-10-CM

## 2021-09-23 DIAGNOSIS — G2589 Other specified extrapyramidal and movement disorders: Secondary | ICD-10-CM

## 2021-09-23 DIAGNOSIS — M25311 Other instability, right shoulder: Secondary | ICD-10-CM

## 2021-09-23 DIAGNOSIS — R29898 Other symptoms and signs involving the musculoskeletal system: Secondary | ICD-10-CM

## 2021-09-23 DIAGNOSIS — M6281 Muscle weakness (generalized): Secondary | ICD-10-CM

## 2021-09-23 MED ORDER — IOPAMIDOL (ISOVUE-M 200) INJECTION 41%
13.0000 mL | Freq: Once | INTRAMUSCULAR | Status: AC
  Administered 2021-09-23: 13 mL via INTRA_ARTICULAR

## 2021-09-23 NOTE — Therapy (Signed)
OUTPATIENT PHYSICAL THERAPY CERVICAL EVALUATION   Patient Name: Steven Chan MRN: [id number] DOB:[birth year], 26 y.o., male Today's Date: 09/23/2021   PT End of Session - 09/23/21 0722     Visit Number 5    Number of Visits 12    Date for PT Re-Evaluation 10/14/21    PT Start Time 0720    PT Stop Time 0809    PT Time Calculation (min) 49 min    Activity Tolerance Patient tolerated treatment well             History reviewed. No pertinent past medical history. Past Surgical History:  Procedure Laterality Date   MYRINGOTOMY     WISDOM TOOTH EXTRACTION     There are no problems to display for this patient.   PCP: Dr Caryn Bee Via  REFERRING PROVIDER: Dr Cammy Copa  REFERRING DIAG: Acute Rt shoulder pain   THERAPY DIAG:  Acute pain of right shoulder  Scapular dyskinesis  Other symptoms and signs involving the musculoskeletal system  Muscle weakness (generalized)  Rationale for Evaluation and Treatment Rehabilitation  ONSET DATE: 07/09/21  SUBJECTIVE:                                                                                                                                                                                                         SUBJECTIVE STATEMENT: Patient reports that his shoulders feel good. No pain or feelings of instability. He still feels tightness in the upper traps.   PERTINENT HISTORY:  Denies any other medical problems   PAIN:  Are you having pain? No 0/10 Rt shoulder  Aching, not sharp    PRECAUTIONS: None  WEIGHT BEARING RESTRICTIONS No  FALLS:  Has patient fallen in last 6 months? 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 Awkward lifting up to 350 lb for 2 person lift. Has been at this job 1.5 yrs. He has started riding  motorcycle recently. Some yard work. Otherwise sedentary - sitting in soft sofa  PLOF: Independent  PATIENT GOALS strengthen shoulders and avoid recurrent dislocation   OBJECTIVE:   DIAGNOSTIC FINDINGS:  Xrays - negative   PATIENT SURVEYS:  FOTO 52   COGNITION: Overall cognitive status: Within functional limits for tasks assessed   SENSATION: WFL's per pt report   POSTURE: head forward; shoulders rounded and elevated; head of the humerus anterior in orientation; scapulae abducted and rotated along  the thoracic wall; increased thoracic kyphosis  PALPATION: Muscular tightness pecs; upper trap; leveator; teres bilat    CERVICAL ROM:   Active ROM A/PROM (deg) eval  Flexion 53  Extension 57  Right lateral flexion 28  Left lateral flexion 32  Right rotation 67  Left rotation 68   (Blank rows = not tested)  UPPER EXTREMITY ROM:  Active ROM Right eval Left eval  Shoulder flexion 175 166  Shoulder extension 57 59  Shoulder abduction 157 180  Shoulder adduction        Shoulder internal rotation Thumb to T7 Thumb to T5  Shoulder external rotation 112 shoulder 90 deg/elbow 90 deg  100 shoulder 90 deg/elbow 90 deg                                     (Blank rows = not tested)  UPPER EXTREMITY MMT:  MMT Right eval Left eval  Shoulder flexion 5/5 5/5  Shoulder extension 5/5 5/5  Shoulder abduction 5/5 5/5  Shoulder adduction 5/5 5/5  Shoulder extension 5/5 5/5  Shoulder internal rotation 5/5 5/5  Shoulder external rotation 5/5 5/5  Middle trapezius 4/5 4/5  Lower trapezius 4/5 4/5                                       (Blank rows = not tested)    TODAY'S TREATMENT:  Postural correction/education Education re-office ergonomics Sitting posture using noodle                       Chin tuck 10 sec x 5 HEP Chest lift 10 sec x 10 HEP L's x 10 HEP W's x 10 HEP Doorway stretch 3 positions 30 sec x 3 reps  Upper trap stretch standing 10 sec hold  x 2 each side  Shoulder flexion stretch stepping through the doorway 30 sec x 2 reps Prone scap squeeze 5-10 sec hold x 10 reps  ER red TB x 10 W's red TB x 1o Row blue TB x 10 Bow and arrow blue TB x 10 High row with ER and press up yellow TB x 15 reps  Antirotation blue TB x 10 each side  Counter plank 1 min x 3 reps  Myofacial ball release standing HEP Added:  Serratus w/ red TB Wall clock Push up + clap Reverse wall push up Prone W Prone T prone Y Prone superman   Manual work: Dry needling - consent previously given DN to pecs, upper trap  E-stim mAmp current x 5 min  Twitch response, decreased palpable tightness  Tolerated well     PATIENT EDUCATION:  Education details: POC HEP posture Person educated: Patient Education method: Programmer, multimedia, Facilities manager, Actor cues, Verbal cues, and Handouts Education comprehension: verbalized understanding, returned demonstration, verbal cues required, tactile cues required, and needs further education   HOME EXERCISE PROGRAM:  Access Code: [email or web address] URL: [email or web address] Date: 09/23/2021 Prepared by: Corlis Leak  Exercises - Seated Cervical Retraction  - 3 x daily - 7 x weekly - 1 sets - 10 reps - Standing Scapular Retraction  - 3 x daily - 7 x weekly - 1 sets - 10 reps - 10 hold - Shoulder External Rotation and Scapular Retraction  - 3 x daily - 7 x weekly - 1 sets - 10 reps -  hold - Shoulder External Rotation in 45 Degrees Abduction  - 2 x daily - 7 x weekly - 1-2 sets - 10 reps - 3 sec  hold - Doorway Pec Stretch at 60 Degrees Abduction  - 3 x daily - 7 x weekly - 1 sets - 3 reps - Doorway Pec Stretch at 90 Degrees Abduction  - 3 x daily - 7 x weekly - 1 sets - 3 reps - 30 seconds  hold - Doorway Pec Stretch at 120 Degrees Abduction  - 3 x daily - 7 x weekly - 1 sets - 3 reps - 30 second hold  hold - Standing Shoulder External Rotation with Resistance  - 2 x daily - 7 x weekly - 1-3 sets - 10 reps -  2-3 sec  hold - Shoulder W - External Rotation with Resistance  - 2 x daily - 7 x weekly - 1-2 sets - 10 reps - 3 sec  hold - Standing Bilateral Low Shoulder Row with Anchored Resistance  - 2 x daily - 7 x weekly - 1-3 sets - 10 reps - 2-3 sec  hold - Drawing Bow  - 1 x daily - 7 x weekly - 1 sets - 10 reps - 3 sec  hold - Plank with Hands on Table  - 2 x daily - 7 x weekly - 1 sets - 3 reps - 60 sec  hold - Supine Chest Stretch on Foam Roll  - 2 x daily - 7 x weekly - 1 sets - 1 reps - 2-5 min  sec  hold - Standing Shoulder Flexion Wall Walk  - 2 x daily - 7 x weekly - 1 sets - 3 reps - 30 sec  hold - Prone Scapular Retraction  - 2 x daily - 7 x weekly - 1 sets - 5-10 reps - 3-5 sec  hold - Standing Upper Trapezius Stretch  - 2 x daily - 7 x weekly - 1 sets - 3 reps - 10 sec  hold - High Row with External Rotation and Overhead Press with Resistance Band  - 1 x daily - 7 x weekly - 1-3 sets - 10 reps - 3 sec  hold - Anti-Rotation Lateral Stepping with Press  - 2 x daily - 7 x weekly - 1-2 sets - 10 reps - 2-3 sec  hold - Shoulder Flexion Serratus Activation with Resistance  - 1 x daily - 7 x weekly - 1 sets - 10 reps - 3-5 sec  hold - Wall Clock with Theraband  - 1 x daily - 7 x weekly - 1 sets - 10 reps - 2-3 sec  hold - Wall Push Up  - 1 x daily - 7 x weekly - 1-3 sets - 10 reps - 3 sec  hold - Prone W Scapular Retraction  - 2 x daily - 7 x weekly - 1 sets - 5-10 reps - 3-5 sec  hold - Prone Scapular Retraction Arms at Side  - 2 x daily - 7 x weekly - 1 sets - 5-10 reps - 3-5 sec  hold - Prone Scapular Retraction Y  - 2 x daily - 7 x weekly - 1 sets - 3-5 reps - 5-10 sec  hold - Prone Scapular Retraction in Flexion  - 2 x daily - 7 x weekly - 1 sets - 5-10 reps - 3-5 sec  hold     ASSESSMENT:  CLINICAL IMPRESSION: Progressing well with shoulder rehab. Improving with no  pain and but has continued tightness in upper traps reported. Postural improvement noted in standing and with exercises.  Progressed strengthening exercises without difficulty. No pain. Will continue to work on strength and stability for thoracic spine and shoulder girdle. Continue DN for Rt pecs and upper trap as indicated.    OBJECTIVE IMPAIRMENTS decreased activity tolerance, decreased ROM, decreased strength, hypomobility, increased fascial restrictions, impaired flexibility, impaired UE functional use, improper body mechanics, postural dysfunction, and pain.   ACTIVITY LIMITATIONS carrying, lifting, and reach over head  PARTICIPATION LIMITATIONS: occupation and yard work  PERSONAL FACTORS Fitness, Past/current experiences, and history of multiple subluxations/dislocations  are also affecting patient's functional outcome.   REHAB POTENTIAL: Good  CLINICAL DECISION MAKING: Stable/uncomplicated  EVALUATION COMPLEXITY: Low   GOALS: Goals reviewed with patient? Yes  LONG TERM GOALS: Target date: 11/04/2021   Improve posture and alignment with patient to demonstrate improve upright posture with posterior shoulder girdle engaged - 5/5 strength middle and lower traps bilat   Goal status: INITIAL  2.  Full pain free shoulder AROM  Goal status: INITIAL  3.  Patient reports confidence to return to all normal functional activities using Rt UE   Goal status: INITIAL  4.  Independent in HEP   Goal status: INITIAL  5.  Improve functional assessment score   Goal status: INITIAL     PLAN: PT FREQUENCY: 2x/week  PT DURATION: 6 weeks  PLANNED INTERVENTIONS: Therapeutic exercises, Therapeutic activity, Neuromuscular re-education, Balance training, Gait training, Patient/Family education, Self Care, Joint mobilization, Dry Needling, Electrical stimulation, Cryotherapy, Moist heat, Taping, and Re-evaluation  PLAN FOR NEXT SESSION: Review and progress HEP; continue with postural correction and ergonomic eduction; manual work vs DN as indicated; add prolonged pec stretch and posterior shoulder girdle  strengthening; modalities as indicate. Continue DN and manual work as indicated   W.W. Grainger IncCelyn P Khriz Chan, PT, MPH  09/23/2021, 7:23 AM     [facility] [address] [place], [place], [address] Phone: [phone]   Fax:  [phone]  Physical Therapy Treatment  Patient Details  Name: Steven Chan, Steven Chan No data recorded  Encounter Date: 09/23/2021   PT End of Session - 09/23/21 0722     Visit Number 5    Number of Visits 12    Date for PT Re-Evaluation 10/14/21    PT Start Time 0720    PT Stop Time 0809    PT Time Calculation (min) 49 min    Activity Tolerance Patient tolerated treatment well             History reviewed. No pertinent past medical history.  Past Surgical History:  Procedure Laterality Date   MYRINGOTOMY     WISDOM TOOTH EXTRACTION      There were no vitals filed for this visit.                                            Patient will benefit from skilled therapeutic intervention in order to improve the following deficits and impairments:     Visit Diagnosis: Acute pain of right shoulder  Scapular dyskinesis  Other symptoms and signs involving the musculoskeletal system  Muscle weakness (generalized)     Problem List There are no problems to display for this patient.   Steven Chan, PT, MPH  09/23/2021, 7:23 AM  [facility] [address] [place], [place], [address] Phone: [phone]   Fax:  [phone]  Name: Steven Chan MRN: [id number] Date of Birth: [birth year]  [facility] [address] [place], [place], [address] Phone: [phone]   Fax:  [phone]  Physical Therapy Treatment  Patient Details  Name: Steven Chan MRN: [id number] Date of Birth: [birth year] Chan No data  recorded  Encounter Date: 09/23/2021   PT End of Session - 09/23/21 0722     Visit Number 5    Number of Visits 12    Date for PT Re-Evaluation 10/14/21    PT Start Time 0720    PT Stop Time 0809    PT Time Calculation (min) 49 min    Activity Tolerance Patient tolerated treatment well             History reviewed. No pertinent past medical history.  Past Surgical History:  Procedure Laterality Date   MYRINGOTOMY     WISDOM TOOTH EXTRACTION      There were no vitals filed for this visit.                                            Patient will benefit from skilled therapeutic intervention in order to improve the following deficits and impairments:     Visit Diagnosis: Acute pain of right shoulder  Scapular dyskinesis  Other symptoms and signs involving the musculoskeletal system  Muscle weakness (generalized)     Problem List There are no problems to display for this patient.   Steven Chan, PT 09/23/2021, 7:23 AM  [facility] [address] [place], [place], [address] Phone: [phone]   Fax:  [phone]  Name: Steven Chan MRN: [id number] Date of Birth: [birth year]

## 2021-09-26 NOTE — Progress Notes (Signed)
Hi Lauren can you schedule him a follow-up appointment.  Thanks

## 2021-09-28 ENCOUNTER — Encounter: Payer: 59 | Admitting: Rehabilitative and Restorative Service Providers"

## 2021-09-30 ENCOUNTER — Ambulatory Visit: Payer: 59 | Admitting: Rehabilitative and Restorative Service Providers"

## 2021-09-30 ENCOUNTER — Ambulatory Visit: Payer: 59 | Admitting: Orthopedic Surgery

## 2021-09-30 ENCOUNTER — Encounter: Payer: Self-pay | Admitting: Rehabilitative and Restorative Service Providers"

## 2021-09-30 DIAGNOSIS — M25511 Pain in right shoulder: Secondary | ICD-10-CM | POA: Diagnosis not present

## 2021-09-30 DIAGNOSIS — G2589 Other specified extrapyramidal and movement disorders: Secondary | ICD-10-CM

## 2021-09-30 DIAGNOSIS — R29898 Other symptoms and signs involving the musculoskeletal system: Secondary | ICD-10-CM

## 2021-09-30 DIAGNOSIS — M6281 Muscle weakness (generalized): Secondary | ICD-10-CM

## 2021-09-30 NOTE — Therapy (Addendum)
OUTPATIENT PHYSICAL THERAPY CERVICAL Treatment PHYSICAL THERAPY DISCHARGE SUMMARY  Visits from Start of Care: 6  Current functional level related to goals / functional outcomes: See last progress note for discharge status   Remaining deficits: Needs to continue strength. No report of pain or subluxation   Education / Equipment: HEP   Patient agrees to discharge. Patient goals were met. Patient is being discharged due to meeting the stated rehab goals.  Jerney Baksh P. Helene Kelp PT, MPH 10/29/21 11:36 AM  Patient Name: Steven Chan MRN: [id number] DOB:[birth year], 26 y.o., male Today's Date: 09/30/2021   PT End of Session - 09/30/21 0722     Visit Number 6    Number of Visits 12    Date for PT Re-Evaluation 10/14/21    PT Start Time 7672    PT Stop Time 0806    PT Time Calculation (min) 49 min    Activity Tolerance Patient tolerated treatment well             History reviewed. No pertinent past medical history. Past Surgical History:  Procedure Laterality Date   MYRINGOTOMY     WISDOM TOOTH EXTRACTION     There are no problems to display for this patient.   PCP: Dr Lennette Bihari Via  REFERRING PROVIDER: Dr Meredith Pel  REFERRING DIAG: Acute Rt shoulder pain   THERAPY DIAG:  Acute pain of right shoulder  Scapular dyskinesis  Other symptoms and signs involving the musculoskeletal system  Muscle weakness (generalized)  Rationale for Evaluation and Treatment Rehabilitation  ONSET DATE: 07/09/21  SUBJECTIVE:                                                                                                                                                                                                         SUBJECTIVE STATEMENT: Patient reports that his shoulders feel good. No pain or feelings of instability. He still feels tightness in the upper traps. He is confident in continuing with independent HEP and will call with any questions   PERTINENT HISTORY:  Denies any  other medical problems   PAIN:  Are you having pain? No 0/10 Rt shoulder  Aching, not sharp    PRECAUTIONS: None  WEIGHT BEARING RESTRICTIONS No  FALLS:  Has patient fallen in last 6 months? 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 Awkward lifting up to 350 lb for 2 person lift. Has been  at this job 1.5 yrs. He has started riding motorcycle recently. Some yard work. Otherwise sedentary - sitting in soft sofa  PLOF: Independent  PATIENT GOALS strengthen shoulders and avoid recurrent dislocation   OBJECTIVE:   DIAGNOSTIC FINDINGS:  MRI Rt shoulder - some abnormal signals in the posterior labrum but no tears; tendinosis infraspinatus   PATIENT SURVEYS:  FOTO 52 at eval; 92 09/30/21   COGNITION: Overall cognitive status: Within functional limits for tasks assessed   SENSATION: WFL's per pt report   POSTURE: good improvement in posture with less head forward; decreased shoulders rounded and elevated; head of the humerus more normal and functional in alignment; scapulae much less abducted and rotated along the thoracic wall; improved thoracic kyphosis  PALPATION: Mild muscular tightness pecs; upper trap;   Cervical ROM:   WNL's and pain free  UPPER EXTREMITY ROM:  WNL's and pain free  UPPER EXTREMITY MMT:  MMT Right eval Left eval  Shoulder flexion 5/5 5/5  Shoulder extension 5/5 5/5  Shoulder abduction 5/5 5/5  Shoulder adduction 5/5 5/5  Shoulder extension 5/5 5/5  Shoulder internal rotation 5/5 5/5  Shoulder external rotation 5/5 5/5  Middle trapezius 5/5 5/5  Lower trapezius 5/5 5/5                                       (Blank rows = not tested)    TODAY'S TREATMENT:  Postural correction/education Education re-office ergonomics Sitting posture using  noodle                       Chin tuck 10 sec x 5 HEP Chest lift 10 sec x 10 HEP L's x 10 HEP W's x 10 HEP Doorway stretch 3 positions 30 sec x 3 reps  Upper trap stretch standing 10 sec hold x 2 each side  Shoulder flexion stretch stepping through the doorway 30 sec x 2 reps Prone scap squeeze 5-10 sec hold x 10 reps  ER red TB x 10 W's red TB x 1o Row blue TB x 10 Bow and arrow blue TB x 10 High row with ER and press up yellow TB x 15 reps  Antirotation blue TB x 10 each side  Counter plank 1 min x 3 reps  Myofacial ball release standing HEP Serratus w/ red TB Wall clock Push up + clap Reverse wall push up Prone W Prone T prone Y Prone superman  Body blade x 1 min each UE in flexion Body blade waist to overhead x 1 min bilat  Dead lift 20# KB x 10 x 2 sets Overhead carry bottoms up 5# KB bilat walking x 470f Overhead lift bamboo pole with 5 # suspended    Manual work: Dry needling - consent previously given DN to pecs, upper trap  E-stim mAmp current x 5 min  Twitch response, decreased palpable tightness  Tolerated well     PATIENT EDUCATION:  Education details: POC HEP posture Person educated: Patient Education method: EConsulting civil engineer DMedia planner TCorporate treasurercues, Verbal cues, and Handouts Education comprehension: verbalized understanding, returned demonstration, verbal cues required, tactile cues required, and needs further education   HOME EXERCISE PROGRAM:  Access Code: [email or web address]: [email or web address] Date: 09/23/2021 Prepared by: CGillermo Murdoch Exercises - Seated Cervical Retraction  - 3 x daily - 7 x weekly - 1 sets - 10 reps - Standing Scapular Retraction  - 3  x daily - 7 x weekly - 1 sets - 10 reps - 10 hold - Shoulder External Rotation and Scapular Retraction  - 3 x daily - 7 x weekly - 1 sets - 10 reps -   hold - Shoulder External Rotation in 45 Degrees Abduction  - 2 x daily - 7 x weekly - 1-2 sets - 10 reps - 3 sec  hold - Doorway  Pec Stretch at 60 Degrees Abduction  - 3 x daily - 7 x weekly - 1 sets - 3 reps - Doorway Pec Stretch at 90 Degrees Abduction  - 3 x daily - 7 x weekly - 1 sets - 3 reps - 30 seconds  hold - Doorway Pec Stretch at 120 Degrees Abduction  - 3 x daily - 7 x weekly - 1 sets - 3 reps - 30 second hold  hold - Standing Shoulder External Rotation with Resistance  - 2 x daily - 7 x weekly - 1-3 sets - 10 reps - 2-3 sec  hold - Shoulder W - External Rotation with Resistance  - 2 x daily - 7 x weekly - 1-2 sets - 10 reps - 3 sec  hold - Standing Bilateral Low Shoulder Row with Anchored Resistance  - 2 x daily - 7 x weekly - 1-3 sets - 10 reps - 2-3 sec  hold - Drawing Bow  - 1 x daily - 7 x weekly - 1 sets - 10 reps - 3 sec  hold - Plank with Hands on Table  - 2 x daily - 7 x weekly - 1 sets - 3 reps - 60 sec  hold - Supine Chest Stretch on Foam Roll  - 2 x daily - 7 x weekly - 1 sets - 1 reps - 2-5 min  sec  hold - Standing Shoulder Flexion Wall Walk  - 2 x daily - 7 x weekly - 1 sets - 3 reps - 30 sec  hold - Prone Scapular Retraction  - 2 x daily - 7 x weekly - 1 sets - 5-10 reps - 3-5 sec  hold - Standing Upper Trapezius Stretch  - 2 x daily - 7 x weekly - 1 sets - 3 reps - 10 sec  hold - High Row with External Rotation and Overhead Press with Resistance Band  - 1 x daily - 7 x weekly - 1-3 sets - 10 reps - 3 sec  hold - Anti-Rotation Lateral Stepping with Press  - 2 x daily - 7 x weekly - 1-2 sets - 10 reps - 2-3 sec  hold - Shoulder Flexion Serratus Activation with Resistance  - 1 x daily - 7 x weekly - 1 sets - 10 reps - 3-5 sec  hold - Wall Clock with Theraband  - 1 x daily - 7 x weekly - 1 sets - 10 reps - 2-3 sec  hold - Wall Push Up  - 1 x daily - 7 x weekly - 1-3 sets - 10 reps - 3 sec  hold - Prone W Scapular Retraction  - 2 x daily - 7 x weekly - 1 sets - 5-10 reps - 3-5 sec  hold - Prone Scapular Retraction Arms at Side  - 2 x daily - 7 x weekly - 1 sets - 5-10 reps - 3-5 sec  hold - Prone  Scapular Retraction Y  - 2 x daily - 7 x weekly - 1 sets - 3-5 reps - 5-10 sec  hold -  Prone Scapular Retraction in Flexion  - 2 x daily - 7 x weekly - 1 sets - 5-10 reps - 3-5 sec  hold     ASSESSMENT:  CLINICAL IMPRESSION: Progressing well with shoulder rehab. Improving with no pain and but has continued tightness in upper traps reported. Postural improvement noted in standing and with exercises. Progressed strengthening exercises without difficulty. No pain. Will continue to work on strength and stability for thoracic spine and shoulder girdle. Continue DN for Rt pecs and upper trap as indicated. Goals accomplished.    OBJECTIVE IMPAIRMENTS decreased activity tolerance, decreased ROM, decreased strength, hypomobility, increased fascial restrictions, impaired flexibility, impaired UE functional use, improper body mechanics, postural dysfunction, and pain.   ACTIVITY LIMITATIONS carrying, lifting, and reach over head  PARTICIPATION LIMITATIONS: occupation and yard work  PERSONAL FACTORS Fitness, Past/current experiences, and history of multiple subluxations/dislocations  are also affecting patient's functional outcome.   REHAB POTENTIAL: Good  CLINICAL DECISION MAKING: Stable/uncomplicated  EVALUATION COMPLEXITY: Low   GOALS:              09/30/21: Goals of therapy have been accomplished Goals reviewed with patient? Yes  LONG TERM GOALS: Target date: 11/11/2021   Improve posture and alignment with patient to demonstrate improve upright posture with posterior shoulder girdle engaged - 5/5 strength middle and lower traps bilat   Goal status: INITIAL  2.  Full pain free shoulder AROM  Goal status: INITIAL  3.  Patient reports confidence to return to all normal functional activities using Rt UE   Goal status: INITIAL  4.  Independent in HEP   Goal status: INITIAL  5.  Improve functional assessment score   Goal status: INITIAL     PLAN: PT FREQUENCY: 2x/week  PT  DURATION: 6 weeks  PLANNED INTERVENTIONS: Therapeutic exercises, Therapeutic activity, Neuromuscular re-education, Balance training, Gait training, Patient/Family education, Self Care, Joint mobilization, Dry Needling, Electrical stimulation, Cryotherapy, Moist heat, Taping, and Re-evaluation  PLAN FOR NEXT SESSION: Patient will be on hold. He will continue with independent HEP and call with any questions or problems.    Everardo All, PT, MPH  09/30/2021, [place], [place], [address] Phone: [phone]   Fax:  [phone]  Physical Therapy Treatment  Patient Details  Name: Steven Chan MRN: [id number] Date of Birth: 1995-09-13 No data recorded  Encounter Date: 09/30/2021   PT End of Session - 09/30/21 0722     Visit Number 6    Number of Visits 12    Date for PT Re-Evaluation 10/14/21    PT Start Time 0717    PT Stop Time 0806    PT Time Calculation (min) 49 min    Activity Tolerance Patient tolerated treatment well             History reviewed. No pertinent past medical history.  Past Surgical History:  Procedure Laterality Date   MYRINGOTOMY     WISDOM TOOTH EXTRACTION      There were no vitals filed for this visit.   Subjective Assessment - 09/30/21 0725     Subjective Patient reports that his shoulders are doing wel except that he has been waking up the lst two days with tightness in the Rt upper. It loosens after he is up and moving. Continues to work on exercises without difficulty. No shoulder pain.    Diagnostic tests MRI Rt shoulder;1. Abnormal signal extending through  the posterior labrum at the  labral cartilaginous junction concerning for prior posterior labral  injury, but no contrast extending through the posterior labrum to  suggest an acute tear. Mild osseous contusion of the anteromedial  humeral head and minimal osseous contusion of the posterior  glenoid  as can be seen in the setting of posterior shoulder dislocation.  2. Patulous joint capsule.  3. Mild tendinosis of the infraspinatus tendon.    Currently in Pain? No/denies    Multiple Pain Sites No                                                     Patient will benefit from skilled therapeutic intervention in order to improve the following deficits and impairments:     Visit Diagnosis: Acute pain of right shoulder  Scapular dyskinesis  Other symptoms and signs involving the musculoskeletal system  Muscle weakness (generalized)     Problem List There are no problems to display for this patient.   Steven Chan Nilda Simmer, PT, MPH  09/30/2021, 7:59 AM  [facility] [place], [place], [address] Phone: [phone]   Fax:  [phone]  Name: Steven Chan MRN: [id number] Date of Birth: October 07, 1995  [place] [facility] [address] [place], [place], [address] Phone: [phone]   Fax:  [phone]  Physical Therapy Treatment  Patient Details  Name: Steven Chan MRN: [id number] Date of Birth: Dec 02, 1995 No data recorded  Encounter Date: 09/30/2021   PT End of Session - 09/30/21 0722     Visit Number 6    Number of Visits 12    Date for PT Re-Evaluation 10/14/21    PT Start Time 0717    PT Stop Time 0806    PT Time Calculation (min) 49 min    Activity Tolerance Patient tolerated treatment well             History reviewed. No pertinent past medical history.  Past Surgical History:  Procedure Laterality Date   MYRINGOTOMY     WISDOM TOOTH EXTRACTION      There were no vitals filed for this visit.   Subjective Assessment - 09/30/21 0725     Subjective Patient reports that his shoulders are doing well. He has had some tightness in the Rt upper trap when he wakes up in the morning. Tightness  resolves when he is up moving. He did have his MRI of the Rt shoulder last week but has not talked with MD about results.    Diagnostic tests MRI Rt shoulder;1. Abnormal signal extending through the posterior labrum at the  labral cartilaginous junction concerning for prior posterior labral  injury, but no contrast extending through the posterior labrum to  suggest an acute tear. Mild osseous contusion of the anteromedial  humeral head and minimal osseous contusion of the posterior glenoid  as can be seen in the setting of posterior shoulder dislocation.  2. Patulous joint capsule.  3. Mild tendinosis of the infraspinatus tendon.    Currently in Pain? No/denies    Multiple Pain Sites No            Treatment:  Postural correction/education  Exercises: Upper trap stretch standing 10 sec x 2 reps each side Upper sitting with overpressure 10 sec x 2 reps  each side  Dead lift from floor 20# KB x 10 x 2 sets KB carry 5# each hand x 400 ft  Overhead press with bamboo pole 2.5 # wt each side x 10 x 2 sets Body blade flexion 1 min each side Body blade waist to overhead x 2 min hands together  Trigger Point Dry-Needling  Treatment instructions: Expect mild to moderate muscle soreness. S/S of pneumothorax if dry needled over a lung field, and to seek immediate medical attention should they occur. Patient verbalized understanding of these instructions and education.  Patient Consent Given: Yes Education handout provided: Previously provided Muscles treated: upper traps bilat  Electrical stimulation performed: Yes Parameters:  mAmp current x 5 min  Treatment response/outcome: decreased muscular tightness noted post treatment    Objective: Muscular tightness noted Rt > Lt upper traps  Assessment:  No shoulder pain but some continued muscular tightness noted through Rt > Lt upper traps. Progressing well with exercises to strengthening bilat UE's and improve posture and alignment. Progressing well  toward stated goals of therapy.   Access Code: [email or web address] URL: [email or web address] Date: 09/30/2021 Prepared by: Gillermo Murdoch  HEP: Exercises - Seated Cervical Retraction  - 3 x daily - 7 x weekly - 1 sets - 10 reps - Standing Scapular Retraction  - 3 x daily - 7 x weekly - 1 sets - 10 reps - 10 hold - Shoulder External Rotation and Scapular Retraction  - 3 x daily - 7 x weekly - 1 sets - 10 reps -   hold - Shoulder External Rotation in 45 Degrees Abduction  - 2 x daily - 7 x weekly - 1-2 sets - 10 reps - 3 sec  hold - Doorway Pec Stretch at 60 Degrees Abduction  - 3 x daily - 7 x weekly - 1 sets - 3 reps - Doorway Pec Stretch at 90 Degrees Abduction  - 3 x daily - 7 x weekly - 1 sets - 3 reps - 30 seconds  hold - Doorway Pec Stretch at 120 Degrees Abduction  - 3 x daily - 7 x weekly - 1 sets - 3 reps - 30 second hold  hold - Standing Shoulder External Rotation with Resistance  - 2 x daily - 7 x weekly - 1-3 sets - 10 reps - 2-3 sec  hold - Shoulder W - External Rotation with Resistance  - 2 x daily - 7 x weekly - 1-2 sets - 10 reps - 3 sec  hold - Standing Bilateral Low Shoulder Row with Anchored Resistance  - 2 x daily - 7 x weekly - 1-3 sets - 10 reps - 2-3 sec  hold - Drawing Bow  - 1 x daily - 7 x weekly - 1 sets - 10 reps - 3 sec  hold - Plank with Hands on Table  - 2 x daily - 7 x weekly - 1 sets - 3 reps - 60 sec  hold - Supine Chest Stretch on Foam Roll  - 2 x daily - 7 x weekly - 1 sets - 1 reps - 2-5 min  sec  hold - Standing Shoulder Flexion Wall Walk  - 2 x daily - 7 x weekly - 1 sets - 3 reps - 30 sec  hold - Prone Scapular Retraction  - 2 x daily - 7 x weekly - 1 sets - 5-10 reps - 3-5 sec  hold - Standing Upper Trapezius Stretch  - 2 x daily - 7 x weekly -  1 sets - 3 reps - 10 sec  hold - High Row with External Rotation and Overhead Press with Resistance Band  - 1 x daily - 7 x weekly - 1-3 sets - 10 reps - 3 sec  hold - Anti-Rotation Lateral Stepping with Press   - 2 x daily - 7 x weekly - 1-2 sets - 10 reps - 2-3 sec  hold - Shoulder Flexion Serratus Activation with Resistance  - 1 x daily - 7 x weekly - 1 sets - 10 reps - 3-5 sec  hold - Wall Clock with Theraband  - 1 x daily - 7 x weekly - 1 sets - 10 reps - 2-3 sec  hold - Wall Push Up  - 1 x daily - 7 x weekly - 1-3 sets - 10 reps - 3 sec  hold - Prone W Scapular Retraction  - 2 x daily - 7 x weekly - 1 sets - 5-10 reps - 3-5 sec  hold - Prone Scapular Retraction Arms at Side  - 2 x daily - 7 x weekly - 1 sets - 5-10 reps - 3-5 sec  hold - Prone Scapular Retraction Y  - 2 x daily - 7 x weekly - 1 sets - 3-5 reps - 5-10 sec  hold - Prone Scapular Retraction in Flexion  - 2 x daily - 7 x weekly - 1 sets - 5-10 reps - 3-5 sec  hold - Seated Cervical Sidebending Stretch  - 2 x daily - 7 x weekly - 1 sets - 3-5 reps - 5-10 sec  hold - Upper Trapezius Stretch  - 2 x daily - 7 x weekly - 1 sets - 3 reps - 5-10 sec  hold - Kettlebell Deadlift  - 2 x daily - 7 x weekly - 1-2 sets - 10 reps - 2-3 sec  hold - Standing Overhead Press with Dumbbells at [place]:  Continue treatment 1x/wk with focus on strengthening and stabilization.    There are no problems to display for this patient.   Everardo All, PT, MPH  09/30/2021, 7:59 AM  [facility] [place], [place], [address] Phone: [phone]   Fax:  [phone]  Name: Steven Chan MRN: [id number] Date of Birth: 1995/10/31

## 2021-10-07 ENCOUNTER — Ambulatory Visit: Payer: 59 | Admitting: Rehabilitative and Restorative Service Providers"

## 2021-10-09 ENCOUNTER — Encounter: Payer: Self-pay | Admitting: Orthopedic Surgery

## 2021-10-09 ENCOUNTER — Ambulatory Visit: Payer: 59 | Admitting: Orthopedic Surgery

## 2021-10-09 DIAGNOSIS — M25311 Other instability, right shoulder: Secondary | ICD-10-CM | POA: Diagnosis not present
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
## 2021-10-19 ENCOUNTER — Other Ambulatory Visit: Payer: Self-pay

## 2021-10-19 ENCOUNTER — Encounter (HOSPITAL_COMMUNITY): Payer: Self-pay | Admitting: Orthopedic Surgery

## 2021-10-19 NOTE — Progress Notes (Signed)
Spoke with pt for pre-op call. Pt denies cardiac history, HTN or Diabetes.   Shower instructions given to pt.  

## 2021-10-20 ENCOUNTER — Ambulatory Visit (HOSPITAL_COMMUNITY): Payer: 59 | Admitting: Anesthesiology

## 2021-10-20 ENCOUNTER — Encounter (HOSPITAL_COMMUNITY)
Admission: RE | Disposition: A | Discharge status: Home / Self Care | Payer: Self-pay | Source: Home / Self Care | Attending: Orthopedic Surgery

## 2021-10-20 ENCOUNTER — Other Ambulatory Visit: Payer: Self-pay

## 2021-10-20 ENCOUNTER — Ambulatory Visit (HOSPITAL_BASED_OUTPATIENT_CLINIC_OR_DEPARTMENT_OTHER): Payer: 59 | Admitting: Anesthesiology

## 2021-10-20 ENCOUNTER — Encounter (HOSPITAL_COMMUNITY): Payer: Self-pay | Admitting: Orthopedic Surgery

## 2021-10-20 ENCOUNTER — Ambulatory Visit (HOSPITAL_COMMUNITY)
Admission: RE | Admit: 2021-10-20 | Discharge: 2021-10-20 | Disposition: A | Discharge status: Home / Self Care | Payer: 59 | Attending: Orthopedic Surgery | Admitting: Orthopedic Surgery

## 2021-10-20 DIAGNOSIS — M24411 Recurrent dislocation, right shoulder: Secondary | ICD-10-CM

## 2021-10-20 DIAGNOSIS — Z01818 Encounter for other preprocedural examination: Secondary | ICD-10-CM

## 2021-10-20 DIAGNOSIS — S43431A Superior glenoid labrum lesion of right shoulder, initial encounter: Secondary | ICD-10-CM | POA: Diagnosis not present

## 2021-10-20 DIAGNOSIS — X500XXA Overexertion from strenuous movement or load, initial encounter: Secondary | ICD-10-CM | POA: Diagnosis not present

## 2021-10-20 DIAGNOSIS — M25511 Pain in right shoulder: Secondary | ICD-10-CM | POA: Diagnosis present

## 2021-10-20 DIAGNOSIS — M25311 Other instability, right shoulder: Secondary | ICD-10-CM | POA: Insufficient documentation

## 2021-10-20 DIAGNOSIS — M24419 Recurrent dislocation, unspecified shoulder: Secondary | ICD-10-CM

## 2021-10-20 HISTORY — DX: Depression, unspecified: F32.A

## 2021-10-20 HISTORY — DX: Other seasonal allergic rhinitis: J30.2

## 2021-10-20 HISTORY — DX: Anxiety disorder, unspecified: F41.9

## 2021-10-20 HISTORY — PX: SHOULDER ARTHROSCOPY WITH LABRAL REPAIR: SHX5691

## 2021-10-20 LAB — BASIC METABOLIC PANEL
Anion gap: 6 (ref 5–15)
BUN: 11 mg/dL (ref 6–20)
CO2: 26 mmol/L (ref 22–32)
Calcium: 9.6 mg/dL (ref 8.9–10.3)
Chloride: 108 mmol/L (ref 98–111)
Creatinine, Ser: 1.03 mg/dL (ref 0.61–1.24)
GFR, Estimated: >60 mL/min (ref >60)
Glucose, Bld: 95 mg/dL (ref 70–99)
Potassium: 3.7 mmol/L (ref 3.5–5.1)
Sodium: 140 mmol/L (ref 135–145)

## 2021-10-20 LAB — CBC
HCT: 46.9 % (ref 39.0–52.0)
Hemoglobin: 16.7 g/dL (ref 13.0–17.0)
MCH: 31.2 pg (ref 26.0–34.0)
MCHC: 35.6 g/dL (ref 30.0–36.0)
MCV: 87.7 fL (ref 80.0–100.0)
Platelets: 179 10*3/uL (ref 150–400)
RBC: 5.35 MIL/uL (ref 4.22–5.81)
RDW: 12.4 % (ref 11.5–15.5)
WBC: 4.4 10*3/uL (ref 4.0–10.5)
nRBC: 0 % (ref 0.0–0.2)

## 2021-10-20 SURGERY — ARTHROSCOPY, SHOULDER, WITH GLENOID LABRUM REPAIR
Anesthesia: Regional | Site: Shoulder | Laterality: Right

## 2021-10-20 MED ORDER — TRANEXAMIC ACID-NACL 1000-0.7 MG/100ML-% IV SOLN
1000.0000 mg | INTRAVENOUS | Status: AC
  Administered 2021-10-20: 1000 mg via INTRAVENOUS
  Filled 2021-10-20: qty 100

## 2021-10-20 MED ORDER — POVIDONE-IODINE 7.5 % EX SOLN
Freq: Once | CUTANEOUS | Status: DC
  Filled 2021-10-20: qty 118

## 2021-10-20 MED ORDER — MIDAZOLAM HCL 2 MG/2ML IJ SOLN: 2.0000 mg | Freq: Once | INTRAMUSCULAR | Status: AC

## 2021-10-20 MED ORDER — EPINEPHRINE PF 1 MG/ML IJ SOLN
INTRAMUSCULAR | Status: AC
  Filled 2021-10-20: qty 4

## 2021-10-20 MED ORDER — OXYCODONE HCL 5 MG PO TABS: 5.0000 mg | ORAL_TABLET | Freq: Once | ORAL | Status: DC | PRN

## 2021-10-20 MED ORDER — LIDOCAINE 2% (20 MG/ML) 5 ML SYRINGE
INTRAMUSCULAR | Status: AC
  Filled 2021-10-20: qty 5

## 2021-10-20 MED ORDER — ONDANSETRON HCL 4 MG/2ML IJ SOLN: 4.0000 mg | Freq: Once | INTRAMUSCULAR | Status: DC | PRN

## 2021-10-20 MED ORDER — ONDANSETRON HCL 4 MG/2ML IJ SOLN
INTRAMUSCULAR | Status: AC
  Filled 2021-10-20: qty 2

## 2021-10-20 MED ORDER — MIDAZOLAM HCL 2 MG/2ML IJ SOLN
INTRAMUSCULAR | Status: AC
  Filled 2021-10-20: qty 2

## 2021-10-20 MED ORDER — PROPOFOL 10 MG/ML IV BOLUS
INTRAVENOUS | Status: AC
  Filled 2021-10-20: qty 20

## 2021-10-20 MED ORDER — CHLORHEXIDINE GLUCONATE 0.12 % MT SOLN
15.0000 mL | Freq: Once | OROMUCOSAL | Status: AC
  Administered 2021-10-20: 15 mL via OROMUCOSAL
  Filled 2021-10-20: qty 15

## 2021-10-20 MED ORDER — FENTANYL CITRATE (PF) 100 MCG/2ML IJ SOLN
INTRAMUSCULAR | Status: AC
  Administered 2021-10-20: 100 ug via INTRAVENOUS
  Filled 2021-10-20: qty 2

## 2021-10-20 MED ORDER — OXYCODONE HCL 5 MG/5ML PO SOLN: 5.0000 mg | Freq: Once | ORAL | Status: DC | PRN

## 2021-10-20 MED ORDER — GLYCOPYRROLATE PF 0.2 MG/ML IJ SOSY
PREFILLED_SYRINGE | INTRAMUSCULAR | Status: AC
  Filled 2021-10-20: qty 1

## 2021-10-20 MED ORDER — KETOROLAC TROMETHAMINE 30 MG/ML IJ SOLN: 30.0000 mg | Freq: Once | INTRAMUSCULAR | Status: DC | PRN

## 2021-10-20 MED ORDER — EPHEDRINE SULFATE-NACL 50-0.9 MG/10ML-% IV SOSY
PREFILLED_SYRINGE | INTRAVENOUS | Status: DC | PRN
  Administered 2021-10-20 (×3): 5 mg via INTRAVENOUS

## 2021-10-20 MED ORDER — EPINEPHRINE PF 1 MG/ML IJ SOLN
INTRAMUSCULAR | Status: DC | PRN
  Administered 2021-10-20 (×4): 1 mg

## 2021-10-20 MED ORDER — ROCURONIUM BROMIDE 10 MG/ML (PF) SYRINGE
PREFILLED_SYRINGE | INTRAVENOUS | Status: DC | PRN
  Administered 2021-10-20: 70 mg via INTRAVENOUS

## 2021-10-20 MED ORDER — SUGAMMADEX SODIUM 200 MG/2ML IV SOLN
INTRAVENOUS | Status: DC | PRN
  Administered 2021-10-20: 200 mg via INTRAVENOUS

## 2021-10-20 MED ORDER — PHENYLEPHRINE HCL-NACL 20-0.9 MG/250ML-% IV SOLN
INTRAVENOUS | Status: DC | PRN
  Administered 2021-10-20: 50 ug/min via INTRAVENOUS

## 2021-10-20 MED ORDER — CEFAZOLIN SODIUM-DEXTROSE 2-4 GM/100ML-% IV SOLN
2.0000 g | INTRAVENOUS | Status: AC
  Administered 2021-10-20: 2 g via INTRAVENOUS
  Filled 2021-10-20: qty 100

## 2021-10-20 MED ORDER — OXYCODONE-ACETAMINOPHEN 5-325 MG PO TABS: 1.0000 | ORAL_TABLET | ORAL | 0 refills | Status: DC | PRN

## 2021-10-20 MED ORDER — EPINEPHRINE PF 1 MG/ML IJ SOLN
INTRAMUSCULAR | Status: AC
  Filled 2021-10-20: qty 1

## 2021-10-20 MED ORDER — DEXAMETHASONE SODIUM PHOSPHATE 10 MG/ML IJ SOLN
INTRAMUSCULAR | Status: AC
  Filled 2021-10-20: qty 1

## 2021-10-20 MED ORDER — ROPIVACAINE HCL 5 MG/ML IJ SOLN
INTRAMUSCULAR | Status: DC | PRN
  Administered 2021-10-20: 15 mL via PERINEURAL

## 2021-10-20 MED ORDER — MIDAZOLAM HCL 2 MG/2ML IJ SOLN
INTRAMUSCULAR | Status: AC
  Administered 2021-10-20: 2 mg via INTRAVENOUS
  Filled 2021-10-20: qty 2

## 2021-10-20 MED ORDER — HYDROMORPHONE HCL 1 MG/ML IJ SOLN: 0.2500 mg | INTRAMUSCULAR | Status: DC | PRN

## 2021-10-20 MED ORDER — 0.9 % SODIUM CHLORIDE (POUR BTL) OPTIME
TOPICAL | Status: DC | PRN
  Administered 2021-10-20: 1000 mL

## 2021-10-20 MED ORDER — SODIUM CHLORIDE 0.9 % IR SOLN
Status: DC | PRN
  Administered 2021-10-20 (×18): 3000 mL

## 2021-10-20 MED ORDER — MEPERIDINE HCL 25 MG/ML IJ SOLN: 6.2500 mg | INTRAMUSCULAR | Status: DC | PRN

## 2021-10-20 MED ORDER — CELECOXIB 100 MG PO CAPS: 100.0000 mg | ORAL_CAPSULE | Freq: Two times a day (BID) | ORAL | 0 refills | Status: DC

## 2021-10-20 MED ORDER — ROCURONIUM BROMIDE 10 MG/ML (PF) SYRINGE
PREFILLED_SYRINGE | INTRAVENOUS | Status: AC
  Filled 2021-10-20: qty 10

## 2021-10-20 MED ORDER — AMISULPRIDE (ANTIEMETIC) 5 MG/2ML IV SOLN: 10.0000 mg | Freq: Once | INTRAVENOUS | Status: DC | PRN

## 2021-10-20 MED ORDER — FENTANYL CITRATE (PF) 250 MCG/5ML IJ SOLN
INTRAMUSCULAR | Status: AC
  Filled 2021-10-20: qty 5

## 2021-10-20 MED ORDER — ORAL CARE MOUTH RINSE: 15.0000 mL | Freq: Once | OROMUCOSAL | Status: AC

## 2021-10-20 MED ORDER — BUPIVACAINE LIPOSOME 1.3 % IJ SUSP
INTRAMUSCULAR | Status: DC | PRN
  Administered 2021-10-20: 10 mL via PERINEURAL

## 2021-10-20 MED ORDER — DEXAMETHASONE SODIUM PHOSPHATE 10 MG/ML IJ SOLN
INTRAMUSCULAR | Status: DC | PRN
  Administered 2021-10-20: 5 mg via INTRAVENOUS

## 2021-10-20 MED ORDER — GLYCOPYRROLATE PF 0.2 MG/ML IJ SOSY
PREFILLED_SYRINGE | INTRAMUSCULAR | Status: DC | PRN
  Administered 2021-10-20: .2 mg via INTRAVENOUS

## 2021-10-20 MED ORDER — POVIDONE-IODINE 10 % EX SWAB
2.0000 | Freq: Once | CUTANEOUS | Status: AC
  Administered 2021-10-20: 2 via TOPICAL

## 2021-10-20 MED ORDER — FENTANYL CITRATE (PF) 250 MCG/5ML IJ SOLN
INTRAMUSCULAR | Status: DC | PRN
  Administered 2021-10-20 (×3): 50 ug via INTRAVENOUS

## 2021-10-20 MED ORDER — PROPOFOL 10 MG/ML IV BOLUS
INTRAVENOUS | Status: DC | PRN
  Administered 2021-10-20: 50 mg via INTRAVENOUS
  Administered 2021-10-20: 200 mg via INTRAVENOUS
  Administered 2021-10-20: 50 mg via INTRAVENOUS

## 2021-10-20 MED ORDER — FENTANYL CITRATE (PF) 100 MCG/2ML IJ SOLN: 100.0000 ug | Freq: Once | INTRAMUSCULAR | Status: AC

## 2021-10-20 MED ORDER — LIDOCAINE 2% (20 MG/ML) 5 ML SYRINGE
INTRAMUSCULAR | Status: DC | PRN
  Administered 2021-10-20: 60 mg via INTRAVENOUS

## 2021-10-20 MED ORDER — METHOCARBAMOL 500 MG PO TABS: 500.0000 mg | ORAL_TABLET | Freq: Three times a day (TID) | ORAL | 1 refills | Status: DC | PRN

## 2021-10-20 MED ORDER — ACETAMINOPHEN 500 MG PO TABS
1000.0000 mg | ORAL_TABLET | Freq: Once | ORAL | Status: AC
  Administered 2021-10-20: 1000 mg via ORAL
  Filled 2021-10-20: qty 2

## 2021-10-20 MED ORDER — LACTATED RINGERS IV SOLN: INTRAVENOUS | Status: DC

## 2021-10-20 MED ORDER — ONDANSETRON HCL 4 MG/2ML IJ SOLN
INTRAMUSCULAR | Status: DC | PRN
  Administered 2021-10-20: 4 mg via INTRAVENOUS

## 2021-10-20 MED ORDER — MIDAZOLAM HCL 2 MG/2ML IJ SOLN
INTRAMUSCULAR | Status: DC | PRN
  Administered 2021-10-20: 2 mg via INTRAVENOUS

## 2021-10-20 SURGICAL SUPPLY — 56 items
ANCH SUT 2 FBRTK KNTLS 1.8 (Anchor) ×7 IMPLANT
ANCHOR SUT 1.8 FIBERTAK SB KL (Anchor) IMPLANT
BAG COUNTER SPONGE SURGICOUNT (BAG) ×1 IMPLANT
BAG SPNG CNTER NS LX DISP (BAG) ×1
BLADE EXCALIBUR 4.0X13 (MISCELLANEOUS) ×1 IMPLANT
BLADE SHAVER TORPEDO 4X13 (MISCELLANEOUS) IMPLANT
BLADE SURG 11 STRL SS (BLADE) IMPLANT
BNDG CMPR 5X4 CHSV STRCH STRL (GAUZE/BANDAGES/DRESSINGS) ×1
BNDG COHESIVE 4X5 TAN STRL LF (GAUZE/BANDAGES/DRESSINGS) IMPLANT
CANNULA 5.75X71 LONG (CANNULA) IMPLANT
CANNULA 8.25X9 (CANNULA) IMPLANT
CANNULA TWIST IN 8.25X7CM (CANNULA) ×1 IMPLANT
COVER SURGICAL LIGHT HANDLE (MISCELLANEOUS) ×1 IMPLANT
DISSECTOR 4.0MM X 13CM (MISCELLANEOUS) ×1 IMPLANT
DRAPE INCISE IOBAN 66X45 STRL (DRAPES) ×1 IMPLANT
DRAPE STERI 35X30 U-POUCH (DRAPES) ×2 IMPLANT
DRSG TEGADERM 2-3/8X2-3/4 SM (GAUZE/BANDAGES/DRESSINGS) ×1 IMPLANT
DRSG TEGADERM 4X4.75 (GAUZE/BANDAGES/DRESSINGS) IMPLANT
DURAPREP 26ML APPLICATOR (WOUND CARE) ×1 IMPLANT
DW OUTFLOW CASSETTE/TUBE SET (MISCELLANEOUS) ×1 IMPLANT
GAUZE SPONGE 4X4 12PLY STRL (GAUZE/BANDAGES/DRESSINGS) ×1 IMPLANT
GAUZE XEROFORM 1X8 LF (GAUZE/BANDAGES/DRESSINGS) ×1 IMPLANT
GLOVE BIOGEL PI IND STRL 8 (GLOVE) ×1 IMPLANT
GLOVE ECLIPSE 8.0 STRL XLNG CF (GLOVE) ×1 IMPLANT
GLOVE SURG POLYISO LF SZ7 (GLOVE) ×1 IMPLANT
GLOVE SURG SYN 6.5 ES PF (GLOVE) ×2 IMPLANT
GLOVE SURG SYN 6.5 PF PI (GLOVE) ×2 IMPLANT
GOWN STRL REUS W/ TWL LRG LVL3 (GOWN DISPOSABLE) ×3 IMPLANT
GOWN STRL REUS W/TWL LRG LVL3 (GOWN DISPOSABLE) ×3
KIT BASIN OR (CUSTOM PROCEDURE TRAY) ×1 IMPLANT
KIT PERC INSERT 3.0 KNTLS (KITS) IMPLANT
KIT STR SPEAR 1.8 FBRTK DISP (KITS) ×1 IMPLANT
KIT TURNOVER KIT B (KITS) ×1 IMPLANT
LASSO 90 CVE QUICKPAS (DISPOSABLE) IMPLANT
MANIFOLD NEPTUNE II (INSTRUMENTS) ×1 IMPLANT
NDL SPNL 18GX3.5 QUINCKE PK (NEEDLE) ×1 IMPLANT
NDL SUT 2-0 SCORPION KNEE (NEEDLE) ×1 IMPLANT
NEEDLE SPNL 18GX3.5 QUINCKE PK (NEEDLE) ×4 IMPLANT
NEEDLE SUT 2-0 SCORPION KNEE (NEEDLE) ×1 IMPLANT
NS IRRIG 1000ML POUR BTL (IV SOLUTION) ×1 IMPLANT
PACK SHOULDER (CUSTOM PROCEDURE TRAY) ×1 IMPLANT
PAD ARMBOARD 7.5X6 YLW CONV (MISCELLANEOUS) ×2 IMPLANT
PORT APPOLLO RF 90DEGREE MULTI (SURGICAL WAND) IMPLANT
SPONGE T-LAP 4X18 ~~LOC~~+RFID (SPONGE) ×1 IMPLANT
SUT 0 FIBERLINK 1.5 FWIRE CLS (SUTURE) ×3
SUT ETHILON 3 0 PS 1 (SUTURE) ×2 IMPLANT
SUT FIBERWIRE 2-0 18 17.9 3/8 (SUTURE)
SUT VIC AB 3-0 SH 27 (SUTURE) ×1
SUT VIC AB 3-0 SH 27XBRD (SUTURE) IMPLANT
SUTURE 0 FIBRLNK 1.5 FWIRE CLS (SUTURE) IMPLANT
SUTURE FIBERWR 2-0 18 17.9 3/8 (SUTURE) IMPLANT
SYR 30ML LL (SYRINGE) ×1 IMPLANT
TOWEL GREEN STERILE (TOWEL DISPOSABLE) ×1 IMPLANT
TOWEL GREEN STERILE FF (TOWEL DISPOSABLE) ×1 IMPLANT
TUBING ARTHROSCOPY IRRIG 16FT (MISCELLANEOUS) ×1 IMPLANT
WATER STERILE IRR 1000ML POUR (IV SOLUTION) ×1 IMPLANT

## 2021-10-20 NOTE — Anesthesia Procedure Notes (Signed)
Anesthesia Regional Block: Interscalene brachial plexus block   Pre-Anesthetic Checklist: , timeout performed,  Correct Patient, Correct Site, Correct Laterality,  Correct Procedure, Correct Position, site marked,  Risks and benefits discussed,  Surgical consent,  Pre-op evaluation,  At surgeon's request and post-op pain management  Laterality: Right  Prep: Maximum Sterile Barrier Precautions used, chloraprep       Needles:  Injection technique: Single-shot  Needle Type: Echogenic Stimulator Needle     Needle Length: 9cm  Needle Gauge: 22     Additional Needles:   Procedures:,,,, ultrasound used (permanent image in chart),,    Narrative:  Start time: 10/20/2021 12:25 PM End time: 10/20/2021 12:30 PM Injection made incrementally with aspirations every 5 mL.  Performed by: Personally  Anesthesiologist: Lannie Fields, DO  Additional Notes: Monitors applied. No increased pain on injection. No increased resistance to injection. Injection made in 5cc increments. Good needle visualization. Patient tolerated procedure well.
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
## 2021-10-20 NOTE — Anesthesia Procedure Notes (Signed)
Procedure Name: Intubation Date/Time: 10/20/2021 12:58 PM  Performed by: Shary Decamp, CRNAPre-anesthesia Checklist: Patient identified, Patient being monitored, Timeout performed, Emergency Drugs available and Suction available Patient Re-evaluated:Patient Re-evaluated prior to induction Oxygen Delivery Method: Circle System Utilized Preoxygenation: Pre-oxygenation with 100% oxygen Induction Type: IV induction Ventilation: Mask ventilation without difficulty Laryngoscope Size: Miller and 3 Grade View: Grade I Tube type: Oral Tube size: 7.5 mm Number of attempts: 1 Airway Equipment and Method: Stylet Placement Confirmation: ETT inserted through vocal cords under direct vision, positive ETCO2 and breath sounds checked- equal and bilateral Secured at: 22 cm Tube secured with: Tape Dental Injury: Teeth and Oropharynx as per pre-operative assessment

## 2021-10-20 NOTE — Transfer of Care (Signed)
Immediate Anesthesia Transfer of Care Note  Patient: Steven Chan  Procedure(s) Performed: RIGHT SHOULDER POSTERIOR LABRAL TEAR ARTHROSCOPIC (Right: Shoulder)  Patient Location: PACU  Anesthesia Type:GA combined with regional for post-op pain  Level of Consciousness: awake, alert  and oriented  Airway & Oxygen Therapy: Patient Spontanous Breathing  Post-op Assessment: Report given to RN and Post -op Vital signs reviewed and stable  Post vital signs: Reviewed and stable  Last Vitals:  Vitals Value Taken Time  BP 117/99 10/20/21 1650  Temp 36.1 C 10/20/21 1650  Pulse 80 10/20/21 1654  Resp 20 10/20/21 1654  SpO2 93 % 10/20/21 1654  Vitals shown include unvalidated device data.  Last Pain:  Vitals:   10/20/21 1650  PainSc: 0-No pain         Complications: No notable events documented.

## 2021-10-20 NOTE — Op Note (Signed)
NAME: Steven Chan, Steven Chan MEDICAL RECORD NO: [id number] ACCOUNT NO: [id number] DATE OF BIRTH: November 07, 1995 FACILITY: [facility] LOCATION: [facility] PHYSICIAN: Graylin Shiver. August Saucer, MD  Operative Report   DATE OF PROCEDURE: 10/20/2021  PREOPERATIVE DIAGNOSIS:  Right shoulder posterior labral tear.  POSTOPERATIVE DIAGNOSIS:  Right shoulder posterior labral tear and anterior labral tear.  SURGEON:  Graylin Shiver. August Saucer, MD  ASSISTANT:  Karenann Cai.  INDICATIONS:  The patient is a 26 year old patient with recurrent posterior instability.  MRI scanning consistent with posterior capsulolabral injury.  She presents now for operative management after explanation of risks and benefits.  DESCRIPTION OF PROCEDURE:  The patient was brought to the operating room where general anesthetic was induced.  Preoperative antibiotics administered.  Timeout was called.  Right shoulder examined under anesthesia.  Found to have 3+ posterior laxity as  well as 1.5 plus anterior laxity with less than a centimeter sulcus sign.  Passive range of motion was 65/110/180.  The patient was placed in lateral position with left peroneal nerve and left axillary nerve well padded.  Right arm, shoulder and hand  prescrubbed with alcohol and Betadine, allowed to air dry, prepped with DuraPrep solution and draped in sterile manner.  Ioban used to seal the axilla and seal the operative field.  After calling timeout posterior portal was created.  Anterior portal  created under direct visualization.  Diagnostic arthroscopy was performed.  The patient actually had some cleft in the anterior inferior labrum from 3 o'clock to 5 o'clock position. The patient had stable biceps anchor.  He also had significant  capsulolabral disruption from the 7 o'clock to 11 o'clock position on the glenoid.  Portals were reversed and the camera was placed through the anterior portal and a second working portal was established posteriorly.  The second working portal was a   percutaneous portal.  A Gemini cannula was placed.  Next, with the patient in the Arthrex positioner the arm was suspended with 15 pounds of traction and 45 degrees of abduction, 10 degrees of forward flexion with some accessory axillary retraction as  well.  Next, the glenoid face was prepared using a Meniscal rasp.  Good bleeding bone was encountered.  At this time, using Arthrex knotless suture anchors 5 suture anchors were placed at the 7 o'clock, 8 o'clock, 9 o'clock, 10 o'clock, and 11 o'clock  position.  Capsule labral tissue was grasped using the captain hook or the suture crescent hook not going more than 5 mm from the capsulolabral junction in order to establish the repair.  Following tightening of the sutures very stable posterior repair  was achieved.  Next, the portals were again reversed and an accessory anterior portal was created.  Two suture anchors were then placed at the 4 o'clock and 5 o'clock position, essentially to reestablish the bumper anteriorly.  The patient did have some  anterior laxity, but most of his laxity was posterior.  Following placement of those 7 suture anchors thorough irrigation was performed.  Instruments were removed.  Portals were closed using a combination of 3-0 Vicryl, 3-0 nylon.  Impervious dressings  applied.  The patient was then placed into a Donjoy brace to keep his arm in neutral position.  Impervious dressings placed.  Luke's assistance was required at all times for retraction, opening, closing, mobilization of tissue, placement of anchors.  His  assistance was a medical necessity.   PUS D: 10/20/2021 5:03:08 pm T: 10/20/2021 8:07:00 pm  JOB: [id number]/ [id number]

## 2021-10-20 NOTE — Brief Op Note (Signed)
   10/20/2021  4:55 PM  PATIENT:  Steven Chan  26 y.o. male  PRE-OPERATIVE DIAGNOSIS:  right shoulder posterior labral tear  POST-OPERATIVE DIAGNOSIS:  right shoulder posterior labral tear, anterior labral tear  PROCEDURE:  Procedure(s): RIGHT SHOULDER POSTERIOR LABRAL TEAR and anterior labral tear ARTHROSCOPIC repair  SURGEON:  Surgeon(s): Cammy Copa, MD  ASSISTANT: magnant pa  ANESTHESIA:   general  EBL: 10 ml    Total I/O In: 1300 [I.V.:1100; IV Piggyback:200] Out: -   BLOOD ADMINISTERED: none  DRAINS: none   LOCAL MEDICATIONS USED:  none  SPECIMEN:  No Specimen  COUNTS:  YES  TOURNIQUET:  * No tourniquets in log *  DICTATION: .Other Dictation: Dictation Number [id number]  PLAN OF CARE: Discharge to home after PACU  PATIENT DISPOSITION:  PACU - hemodynamically stable

## 2021-10-20 NOTE — Anesthesia Preprocedure Evaluation (Addendum)
Anesthesia Evaluation  Patient identified by MRN, date of birth, ID band Patient awake    Reviewed: Allergy & Precautions, NPO status , Patient's Chart, lab work & pertinent test results  Airway Mallampati: I  TM Distance: >3 FB Neck ROM: Full    Dental  (+) Teeth Intact, Dental Advisory Given   Pulmonary neg pulmonary ROS   Pulmonary exam normal breath sounds clear to auscultation       Cardiovascular negative cardio ROS Normal cardiovascular exam Rhythm:Regular Rate:Normal     Neuro/Psych  PSYCHIATRIC DISORDERS Anxiety Depression    negative neurological ROS     GI/Hepatic negative GI ROS, Neg liver ROS,,,  Endo/Other  negative endocrine ROS    Renal/GU negative Renal ROS  negative genitourinary   Musculoskeletal R shoulder posterior labral tear    Abdominal   Peds  Hematology negative hematology ROS (+)   Anesthesia Other Findings   Reproductive/Obstetrics negative OB ROS                              Anesthesia Physical Anesthesia Plan  ASA: 1  Anesthesia Plan: General and Regional   Post-op Pain Management: Regional block*, Tylenol PO (pre-op)* and Toradol IV (intra-op)*   Induction: Intravenous  PONV Risk Score and Plan: 2 and Ondansetron, Dexamethasone, Midazolam and Treatment may vary due to age or medical condition  Airway Management Planned: Oral ETT  Additional Equipment: None  Intra-op Plan:   Post-operative Plan: Extubation in OR  Informed Consent: I have reviewed the patients History and Physical, chart, labs and discussed the procedure including the risks, benefits and alternatives for the proposed anesthesia with the patient or authorized representative who has indicated his/her understanding and acceptance.     Dental advisory given  Plan Discussed with: CRNA  Anesthesia Plan Comments:          Anesthesia Quick Evaluation

## 2021-10-20 NOTE — Progress Notes (Signed)
Orthopedic Tech Progress Note Patient Details:  WENDAL WILKIE 02-Jan-1996 [id number]  OR RN called requesting an LARGE DONJOY, dropped off to OR DESK   Ortho Devices Type of Ortho Device: Shoulder abduction pillow Ortho Device/Splint Location: RUE Ortho Device/Splint Interventions: Ordered, Other (comment)   Post Interventions Patient Tolerated: Other (comment) Instructions Provided: Other (comment)  Donald Pore 10/20/2021, 4:37 PM

## 2021-10-21 NOTE — Anesthesia Postprocedure Evaluation (Signed)
Anesthesia Post Note  Patient: Steven Chan  Procedure(s) Performed: RIGHT SHOULDER POSTERIOR LABRAL TEAR ARTHROSCOPIC (Right: Shoulder)     Patient location during evaluation: PACU Anesthesia Type: Regional and General Level of consciousness: awake and alert Pain management: pain level controlled Vital Signs Assessment: post-procedure vital signs reviewed and stable Respiratory status: spontaneous breathing, nonlabored ventilation, respiratory function stable and patient connected to nasal cannula oxygen Cardiovascular status: blood pressure returned to baseline and stable Postop Assessment: no apparent nausea or vomiting Anesthetic complications: no   No notable events documented.  Last Vitals:  Vitals:   10/20/21 1705 10/20/21 1720  BP: 105/60 106/61  Pulse: 82 87  Resp: 18 13  Temp:  36.4 C  SpO2: 95% 95%    Last Pain:  Vitals:   10/20/21 1720  PainSc: 0-No pain                 Clarine Elrod S

## 2021-10-23 ENCOUNTER — Encounter (HOSPITAL_COMMUNITY): Payer: Self-pay | Admitting: Orthopedic Surgery

## 2021-10-28 ENCOUNTER — Ambulatory Visit (INDEPENDENT_AMBULATORY_CARE_PROVIDER_SITE_OTHER): Payer: 59 | Admitting: Orthopedic Surgery

## 2021-10-28 DIAGNOSIS — M25311 Other instability, right shoulder: Secondary | ICD-10-CM

## 2021-10-29 ENCOUNTER — Encounter: Payer: Self-pay | Admitting: Orthopedic Surgery

## 2021-10-31 ENCOUNTER — Encounter: Payer: Self-pay | Admitting: Orthopedic Surgery

## 2021-10-31 NOTE — Progress Notes (Signed)
   Post-Op Visit Note   Patient: Steven Chan           Date of Birth: 29-Jan-1996           MRN: [id number] Visit Date: 10/28/2021 PCP: Lawerance Cruel, MD   Assessment & Plan:  Chief Complaint:  Chief Complaint  Patient presents with   Routine Post Op     10/20/21 (8d) Right Shoulder Posterior Labral Tear Arthroscopic      Visit Diagnoses:  1. Instability of right shoulder joint     Plan: Steven Chan is a 26 year old patient who underwent right shoulder arthroscopy with posterior labral repair 10/20/2021.  He also underwent anterior labral repair with 2 anchors.  He is doing well with minimal pain.  On examination deltoid fires.  Sutures are removed.  Continue in sling for 2 more weeks.  We will start him with some range of motion exercises out of the sling in 2 weeks but want to avoid crossarm adduction and posterior capsular stretching.  Follow-Up Instructions: No follow-ups on file.   Orders:  No orders of the defined types were placed in this encounter.  No orders of the defined types were placed in this encounter.   Imaging: No results found.  PMFS History: There are no problems to display for this patient.  Past Medical History:  Diagnosis Date   Anxiety    COVID 07/2019   moderate case - 2 weeks with symptoms   Depression    Seasonal allergies     Family History  Problem Relation Age of Onset   Multiple sclerosis Mother     Past Surgical History:  Procedure Laterality Date   MYRINGOTOMY     SHOULDER ARTHROSCOPY WITH LABRAL REPAIR Right 10/20/2021   Procedure: RIGHT SHOULDER POSTERIOR LABRAL TEAR ARTHROSCOPIC;  Surgeon: Meredith Pel, MD;  Location: [place];  Service: Orthopedics;  Laterality: Right;   WISDOM TOOTH EXTRACTION     Social History   Occupational History   Not on file  Tobacco Use   Smoking status: Never    Passive exposure: Never   Smokeless tobacco: Never  Vaping Use   Vaping Use: Never used  Substance and Sexual Activity    Alcohol use: No   Drug use: No   Sexual activity: Not on file

## 2021-11-01 DIAGNOSIS — M24419 Recurrent dislocation, unspecified shoulder: Secondary | ICD-10-CM

## 2021-11-16 ENCOUNTER — Encounter: Payer: 59 | Admitting: Orthopedic Surgery

## 2021-11-18 ENCOUNTER — Ambulatory Visit (INDEPENDENT_AMBULATORY_CARE_PROVIDER_SITE_OTHER): Payer: 59 | Admitting: Orthopedic Surgery

## 2021-11-18 DIAGNOSIS — M25311 Other instability, right shoulder: Secondary | ICD-10-CM

## 2021-11-19 ENCOUNTER — Encounter: Payer: Self-pay | Admitting: Orthopedic Surgery

## 2021-11-19 NOTE — Progress Notes (Signed)
.    Post-Op Visit Note   Patient: Steven Chan           Date of Birth: 04/10/1995           MRN: [id number] Visit Date: 11/18/2021 PCP: Steven Cruel, MD   Assessment & Plan:  Chief Complaint:  Chief Complaint  Patient presents with   Right Shoulder - Routine Post Op    10/20/2021 right shoulder arthroscopy, labral repair   Visit Diagnoses:  1. Instability of right shoulder joint     Plan: Steven Chan is a 26 year old patient who is now a month out right shoulder arthroscopy with anterior and posterior labral repair.  He has been in a sling all the time.  Not taking pain medication.  He has been typing in the sling and has been working for 3 weeks.  On examination his shoulder does feel like it has good stability posteriorly and anteriorly.  Has about 20 degrees of external rotation.  Plan at this time is to start physical therapy with abduction to 90 and external rotation to 40 degrees.  I do not want him doing any internal rotation and no posterior capsular stretching and no forward flexion for 2 weeks and then after 2 weeks from now it is okay for him to do forward flexion to 90.  In general I want to try to stretch out anteriorly over the next 2 weeks in a mild fashion while protecting his posterior capsular repair from stretching and stress.  Come back in 3 weeks for clinical recheck.  Okay to discontinue the sling but cautioned him against cross arm activity as well as internal rotation of that right arm.  Follow-Up Instructions: No follow-ups on file.   Orders:  Orders Placed This Encounter  Procedures   Ambulatory referral to Physical Therapy   No orders of the defined types were placed in this encounter.   Imaging: No results found.  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    Past Medical History:  Diagnosis Date   Anxiety    COVID 07/2019   moderate case - 2 weeks with  symptoms   Depression    Seasonal allergies     Family History  Problem Relation Age of Onset   Multiple sclerosis Mother     Past Surgical History:  Procedure Laterality Date   MYRINGOTOMY     SHOULDER ARTHROSCOPY WITH LABRAL REPAIR Right 10/20/2021   Procedure: RIGHT SHOULDER POSTERIOR LABRAL TEAR ARTHROSCOPIC;  Surgeon: Meredith Pel, MD;  Location: [place];  Service: Orthopedics;  Laterality: Right;   WISDOM TOOTH EXTRACTION     Social History   Occupational History   Not on file  Tobacco Use   Smoking status: Never    Passive exposure: Never   Smokeless tobacco: Never  Vaping Use   Vaping Use: Never used  Substance and Sexual Activity   Alcohol use: No   Drug use: No   Sexual activity: Not on file

## 2021-11-20 ENCOUNTER — Encounter: Payer: Self-pay | Admitting: Physical Therapy

## 2021-11-20 ENCOUNTER — Ambulatory Visit: Payer: 59 | Attending: Orthopedic Surgery | Admitting: Physical Therapy

## 2021-11-20 ENCOUNTER — Other Ambulatory Visit: Payer: Self-pay

## 2021-11-20 DIAGNOSIS — M6281 Muscle weakness (generalized): Secondary | ICD-10-CM

## 2021-11-20 DIAGNOSIS — R29898 Other symptoms and signs involving the musculoskeletal system: Secondary | ICD-10-CM | POA: Diagnosis present

## 2021-11-20 DIAGNOSIS — G2589 Other specified extrapyramidal and movement disorders: Secondary | ICD-10-CM | POA: Diagnosis present

## 2021-11-20 DIAGNOSIS — M25311 Other instability, right shoulder: Secondary | ICD-10-CM | POA: Insufficient documentation

## 2021-11-20 DIAGNOSIS — M25511 Pain in right shoulder: Secondary | ICD-10-CM | POA: Diagnosis not present

## 2021-11-20 DIAGNOSIS — M25611 Stiffness of right shoulder, not elsewhere classified: Secondary | ICD-10-CM

## 2021-11-20 NOTE — Therapy (Signed)
OUTPATIENT PHYSICAL THERAPY SHOULDER EVALUATION   Patient Name: Steven Chan MRN: [id number] DOB:1995-04-04, 26 y.o., male Today's Date: 11/20/2021   PT End of Session - 11/20/21 0938     Visit Number 1    Number of Visits 24    Date for PT Re-Evaluation 02/12/22    Authorization Type UHC    PT Start Time 6295    PT Stop Time 1015    PT Time Calculation (min) 40 min    Activity Tolerance Patient tolerated treatment well    Behavior During Therapy [facility] for tasks assessed/performed             Past Medical History:  Diagnosis Date   Anxiety    COVID 07/2019   moderate case - 2 weeks with symptoms   Depression    Seasonal allergies    Past Surgical History:  Procedure Laterality Date   MYRINGOTOMY     SHOULDER ARTHROSCOPY WITH LABRAL REPAIR Right 10/20/2021   Procedure: RIGHT SHOULDER POSTERIOR LABRAL TEAR ARTHROSCOPIC;  Surgeon: Meredith Pel, MD;  Location: [place];  Service: Orthopedics;  Laterality: Right;   WISDOM TOOTH EXTRACTION     Patient Active Problem List   Diagnosis Date Noted   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     PCP: Steven Chan  REFERRING PROVIDER: Meredith Pel, MD   REFERRING DIAG: M25.311 (ICD-10-CM) - Instability of right shoulder joint   THERAPY DIAG:  Acute pain of right shoulder  Stiffness of right shoulder, not elsewhere classified  Muscle weakness (generalized)  Other symptoms and signs involving the musculoskeletal system  Rationale for Evaluation and Treatment Rehabilitation  ONSET DATE: 10/20/2021  SUBJECTIVE:                                                                                                                                                                                      SUBJECTIVE STATEMENT: Pt reports he had surgery in September. Has noted no pain or issues. Was d/c-ed from the sling after wearing it for 4 weeks. Notes just some tightness. Will follow up with  Dr. [name] 12/09/21.   PERTINENT HISTORY: 10/20/21 right shoulder arthroscopy with anterior and posterior labral repair; history of multiple shoulder dislocations R&L  PAIN:  Are you having pain? No  PRECAUTIONS: "Ok for ABD to 90, OK for ER to 40 degrees, no IR, no post capsular stretching, no FF x 2 weeks, then ok for FF to 90 degrees"  WEIGHT BEARING RESTRICTIONS No  FALLS:  Has patient fallen in last 6 months? 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[employer])  PLOF: Independent  PATIENT GOALS Improve shoulder ROM, return to full activity (rides motorcycle)  OBJECTIVE:   DIAGNOSTIC FINDINGS:  See imaging tab  PATIENT SURVEYS:  FOTO 38; predicted 69 visit 17  COGNITION:  Overall cognitive status: Within functional limits for tasks assessed     SENSATION: WFL  POSTURE: Thoracic kyphosis, rounded shoulders, shoulder slightly depressed R > L  UPPER EXTREMITY ROM:   Active ROM Right eval Left eval  Shoulder flexion NT 190 AROM  Shoulder extension NT 56 AROM  Shoulder abduction 63 AROM 70 PROM 180 AROM  Shoulder adduction    Shoulder internal rotation NT To T3  Shoulder external rotation 15 AROM * 30 PROM 75 AROM *  Elbow flexion    Elbow extension    Wrist flexion    Wrist extension    Wrist ulnar deviation    Wrist radial deviation    Wrist pronation    Wrist supination    (Blank rows = not tested) * = seated with elbow by side  UPPER EXTREMITY MMT: Did not fully assess R due to acuity of surgery, MMT limited due to precautions and ROM. Observed to be at least the following:  MMT Right eval Left eval  Shoulder flexion  5  Shoulder extension  5  Shoulder abduction 2 5  Shoulder adduction    Shoulder internal rotation  5  Shoulder external rotation 2 5  Middle  trapezius 3 5  Lower trapezius 3 5  Elbow flexion 5 5  Elbow extension 5 5  Wrist flexion    Wrist extension    Wrist ulnar deviation    Wrist radial deviation    Wrist pronation    Wrist supination    Grip strength (lbs) 115, 110, 105 120, 105, 95  (Blank rows = not tested)  SHOULDER SPECIAL TESTS: Did not assess  JOINT MOBILITY TESTING:  Limited due to ortho precautions. PROM appears hypomobile in all planes of motion  PALPATION:  TTP primarily along teres minor/major, lats, subscap   TODAY'S TREATMENT:  Supine: Shoulder PROM ER and abd x 10 each within precautions Shoulder AAROM ER and Abd x 10 each with dowel  Manual therapy: Skilled assessment and palpation for TPDN Trigger Point Dry-Needling  Treatment instructions: Expect mild to moderate muscle soreness. S/S of pneumothorax if dry needled over a lung field, and to seek immediate medical attention should they occur. Patient verbalized understanding of these instructions and education.  Patient Consent Given: Yes Education handout provided: Previously provided Muscles treated: Teres minor, teres major, lats Electrical stimulation performed: No Parameters: N/A Treatment response/outcome: Twitch response illicited, palpable muscle lengthening    PATIENT EDUCATION: Education details: Exam findings, POC, reviewed precautions, initial HEP Person educated: Patient Education method: Explanation, Demonstration, and Handouts Education comprehension: verbalized understanding and returned demonstration   HOME EXERCISE PROGRAM: Access Code: [email or web address] URL: [email or web address] Date: 11/20/2021 Prepared by: Vernon Prey April Kirstie Peri  Exercises - Supine Shoulder External Rotation with Dowel  - 1 x daily - 7 x weekly - 1 sets - 10 reps - 3 sec hold - Supine Shoulder Abduction AAROM with Dowel  - 1 x daily - 7 x weekly - 1 sets - 10 reps - 3 sec hold - Seated Scapular Retraction  - 1 x daily - 7 x weekly -  2 sets - 10 reps  ASSESSMENT:  CLINICAL IMPRESSION: Patient  is a 26 y.o. M who was seen today for physical therapy evaluation and treatment s/p R shoulder arthroscopy with anterior and posterior labral repair on 10/20/21. Current precautions limits shoulder abd to 90 deg and ER to 40 deg. No flexion, adduction, or IR (no stretching of posterior shoulder capsule). After 2 weeks (12/04/2021) can begin shoulder flexion to 90 deg. Assessment significant for R shoulder limited ROM, muscle tightness, and weakness affecting UE function for ADLs. Pt would benefit from PT to address these issues and return to full activities.    OBJECTIVE IMPAIRMENTS decreased mobility, decreased ROM, decreased strength, hypomobility, increased fascial restrictions, increased muscle spasms, impaired UE functional use, improper body mechanics, and postural dysfunction.   ACTIVITY LIMITATIONS carrying, lifting, toileting, dressing, reach over head, and hygiene/grooming  PARTICIPATION LIMITATIONS: meal prep, cleaning, laundry, community activity, and yard work  PERSONAL FACTORS Age, Past/current experiences, and Time since onset of injury/illness/exacerbation are also affecting patient's functional outcome.   REHAB POTENTIAL: Good  CLINICAL DECISION MAKING: Stable/uncomplicated  EVALUATION COMPLEXITY: Low   GOALS: Goals reviewed with patient? Yes  SHORT TERM GOALS: Target date: 01/01/2022   Pt will be ind with initial HEP Baseline: Goal status: INITIAL  2.  Pt will be able to obtain at least 90 deg of shoulder flexion and abd for shoulder elevation Baseline:  Goal status: INITIAL  3.  Pt will be able to obtain at least 40 deg of shoulder ER for improved shoulder ROM Baseline:  Goal status: INITIAL   LONG TERM GOALS: Target date: 02/12/2022    Pt will be ind with maintaining and progressing HEP Baseline:  Goal status: INITIAL  2.  Pt will demo L = R shoulder AROM Baseline:  Goal status:  INITIAL  3.  Pt will be able to lift and carry at least 25# for IADLs and work tasks Baseline:  Goal status: INITIAL  4.  Pt will be able to lift at least 5# overhead to reach into cabinets Baseline:  Goal status: INITIAL  5.  Pt will improve FOTO score to >/= 69 Baseline:  Goal status: INITIAL     PLAN: PT FREQUENCY: 2x/week  PT DURATION: 12 weeks  PLANNED INTERVENTIONS: Therapeutic exercises, Therapeutic activity, Neuromuscular re-education, Balance training, Gait training, Patient/Family education, Self Care, Joint mobilization, Dry Needling, Electrical stimulation, Cryotherapy, Moist heat, scar mobilization, Taping, Vasopneumatic device, Ultrasound, Ionotophoresis 4mg /ml Dexamethasone, Manual therapy, and Re-evaluation  PLAN FOR NEXT SESSION: Review HEP. Continue to work on improving shoulder ROM within precautions. Gentle isometrics and scapular strengthening   Kylina Vultaggio April Ma L Mylissa Lambe, PT 11/20/2021, 10:42 AM

## 2021-11-25 ENCOUNTER — Ambulatory Visit: Payer: 59 | Admitting: Rehabilitative and Restorative Service Providers"

## 2021-11-25 ENCOUNTER — Encounter: Payer: Self-pay | Admitting: Rehabilitative and Restorative Service Providers"

## 2021-11-25 DIAGNOSIS — M6281 Muscle weakness (generalized): Secondary | ICD-10-CM

## 2021-11-25 DIAGNOSIS — M25511 Pain in right shoulder: Secondary | ICD-10-CM

## 2021-11-25 DIAGNOSIS — M25611 Stiffness of right shoulder, not elsewhere classified: Secondary | ICD-10-CM

## 2021-11-25 DIAGNOSIS — G2589 Other specified extrapyramidal and movement disorders: Secondary | ICD-10-CM

## 2021-11-25 NOTE — Therapy (Signed)
OUTPATIENT PHYSICAL THERAPY SHOULDER TREATMENT   Patient Name: Steven Chan MRN: [id number] DOB:04-Aug-1995, 26 y.o., male Today's Date: 11/25/2021   PT End of Session - 11/25/21 1151     Visit Number 2    Number of Visits 24    Date for PT Re-Evaluation 02/12/22    Authorization Type UHC    PT Start Time 1152    PT Stop Time 1220    PT Time Calculation (min) 28 min    Activity Tolerance Patient tolerated treatment well    Behavior During Therapy [facility] for tasks assessed/performed             Past Medical History:  Diagnosis Date   Anxiety    COVID 07/2019   moderate case - 2 weeks with symptoms   Depression    Seasonal allergies    Past Surgical History:  Procedure Laterality Date   MYRINGOTOMY     SHOULDER ARTHROSCOPY WITH LABRAL REPAIR Right 10/20/2021   Procedure: RIGHT SHOULDER POSTERIOR LABRAL TEAR ARTHROSCOPIC;  Surgeon: Cammy Copa, MD;  Location: [facility] OR;  Service: Orthopedics;  Laterality: Right;   WISDOM TOOTH EXTRACTION     Patient Active Problem List   Diagnosis Date Noted   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     PCP: Gildardo Cranker  REFERRING PROVIDER: Cammy Copa, MD   REFERRING DIAG: M25.311 (ICD-10-CM) - Instability of right shoulder joint   THERAPY DIAG:  Acute pain of right shoulder  Stiffness of right shoulder, not elsewhere classified  Muscle weakness (generalized)  Scapular dyskinesis  Rationale for Evaluation and Treatment Rehabilitation  ONSET DATE: 10/20/2021  SUBJECTIVE:                                                                                                                                                                                      SUBJECTIVE STATEMENT: The patient reports driving can be painful due to positioning.    PERTINENT HISTORY: 10/20/21 right shoulder arthroscopy with anterior and posterior labral repair; history of multiple shoulder dislocations  R&L  PAIN:  Are you having pain? Yes: NPRS scale: 2/10 Pain location: Shoulder, notes some discomfort when driving Pain description: soreness Aggravating factors: driving Relieving factors: positioning  PRECAUTIONS: "Ok for ABD to 90, OK for ER to 40 degrees, no IR, no post capsular stretching, no FF x 2 weeks, then ok for FF to 90 degrees"  FALLS:  Has patient fallen in last 6 months? No  PATIENT GOALS Improve shoulder ROM, return to full activity (rides motorcycle)  OBJECTIVE:  TREATMENT TODAY: 11/25/21 THEREX Supine  PROM abduction * see ROM measurements below PROM ER with towel  under distal elbow Elbow flexion/extension AROM Scapular retraction x 5 reps  Sidelying Scapular retraction x 10 reps with tactile cues and PT supporting R UE  SELF Discussed positioning for sleeping to support the R UE  ________________________________________________________________________________________________________________ From eval PATIENT SURVEYS:  FOTO 38; predicted 69 visit 17  UPPER EXTREMITY ROM:   Active ROM Right eval Left eval Right  Shoulder flexion NT 190 AROM Not able  Shoulder extension NT 56 AROM   Shoulder abduction 63 AROM 70 PROM 180 AROM 74 PROM supine  Shoulder adduction     Shoulder internal rotation NT To T3   Shoulder external rotation 15 AROM * 30 PROM 75 AROM * 32 PROM Supine with arm at neutral  Elbow flexion     Elbow extension     (Blank rows = not tested) * = seated with elbow by side  UPPER EXTREMITY MMT: Did not fully assess R due to acuity of surgery, MMT limited due to precautions and ROM. Observed to be at least the following:  MMT Right eval Left eval  Shoulder flexion  5  Shoulder extension  5  Shoulder abduction 2 5  Shoulder adduction    Shoulder internal rotation  5  Shoulder external rotation 2 5  Middle trapezius 3 5  Lower trapezius 3 5  Elbow flexion 5 5  Elbow extension 5 5  Grip strength (lbs) 115, 110, 105 120,  105, 95  (Blank rows = not tested)  TODAY'S TREATMENT: (eval)  Supine: Shoulder PROM ER and abd x 10 each within precautions Shoulder AAROM ER and Abd x 10 each with dowel  Manual therapy: Skilled assessment and palpation for TPDN Trigger Point Dry-Needling  Treatment instructions: Expect mild to moderate muscle soreness. S/S of pneumothorax if dry needled over a lung field, and to seek immediate medical attention should they occur. Patient verbalized understanding of these instructions and education.  Patient Consent Given: Yes Education handout provided: Previously provided Muscles treated: Teres minor, teres major, lats Electrical stimulation performed: No Parameters: N/A Treatment response/outcome: Twitch response illicited, palpable muscle lengthening _______________________________________________________________________________________________________________________________________     PATIENT EDUCATION: Education details: continuation of HEP Person educated: Patient Education method: Programmer, multimedia, Facilities manager, and Handouts Education comprehension: verbalized understanding and returned demonstration   HOME EXERCISE PROGRAM: Access Code: [email or web address] URL: [email or web address] Date: 11/20/2021 Prepared by: Vernon Prey April Kirstie Peri  Exercises - Supine Shoulder External Rotation with Dowel  - 1 x daily - 7 x weekly - 1 sets - 10 reps - 3 sec hold - Supine Shoulder Abduction AAROM with Dowel  - 1 x daily - 7 x weekly - 1 sets - 10 reps - 3 sec hold - Seated Scapular Retraction  - 1 x daily - 7 x weekly - 2 sets - 10 reps  ASSESSMENT:  CLINICAL IMPRESSION: The patient tolerated PROM well today.  He is doing A/AROM in the home within parameters provided by MD.  Plan to continue to work towards STGs/LTGs. Can progress FF in 1 week per MD notes.  OBJECTIVE IMPAIRMENTS decreased mobility, decreased ROM, decreased strength, hypomobility, increased fascial  restrictions, increased muscle spasms, impaired UE functional use, improper body mechanics, and postural dysfunction.   ACTIVITY LIMITATIONS carrying, lifting, toileting, dressing, reach over head, and hygiene/grooming  PARTICIPATION LIMITATIONS: meal prep, cleaning, laundry, community activity, and yard work  GOALS: Goals reviewed with patient? Yes  SHORT TERM GOALS: Target date: 01/01/2022   Pt will be ind with initial HEP Baseline: Goal status: INITIAL  2.  Pt will  be able to obtain at least 90 deg of shoulder flexion and abd for shoulder elevation Baseline:  Goal status: INITIAL  3.  Pt will be able to obtain at least 40 deg of shoulder ER for improved shoulder ROM Baseline:  Goal status: INITIAL   LONG TERM GOALS: Target date: 02/12/2022    Pt will be ind with maintaining and progressing HEP Baseline:  Goal status: INITIAL  2.  Pt will demo L = R shoulder AROM Baseline:  Goal status: INITIAL  3.  Pt will be able to lift and carry at least 25# for IADLs and work tasks Baseline:  Goal status: INITIAL  4.  Pt will be able to lift at least 5# overhead to reach into cabinets Baseline:  Goal status: INITIAL  5.  Pt will improve FOTO score to >/= 69 Baseline:  Goal status: INITIAL  PLAN: PT FREQUENCY: 2x/week  PT DURATION: 12 weeks  PLANNED INTERVENTIONS: Therapeutic exercises, Therapeutic activity, Neuromuscular re-education, Balance training, Gait training, Patient/Family education, Self Care, Joint mobilization, Dry Needling, Electrical stimulation, Cryotherapy, Moist heat, scar mobilization, Taping, Vasopneumatic device, Ultrasound, Ionotophoresis 4mg /ml Dexamethasone, Manual therapy, and Re-evaluation  PLAN FOR NEXT SESSION: Review HEP. Continue to work on improving shoulder ROM within precautions. Gentle isometrics and scapular strengthening   Anglea Gordner, PT 11/25/2021, 1:35 PM

## 2021-11-30 ENCOUNTER — Encounter: Payer: Self-pay | Admitting: Rehabilitative and Restorative Service Providers"

## 2021-11-30 ENCOUNTER — Ambulatory Visit: Payer: 59 | Admitting: Rehabilitative and Restorative Service Providers"

## 2021-11-30 DIAGNOSIS — M25611 Stiffness of right shoulder, not elsewhere classified: Secondary | ICD-10-CM

## 2021-11-30 DIAGNOSIS — G2589 Other specified extrapyramidal and movement disorders: Secondary | ICD-10-CM

## 2021-11-30 DIAGNOSIS — R29898 Other symptoms and signs involving the musculoskeletal system: Secondary | ICD-10-CM

## 2021-11-30 DIAGNOSIS — M25511 Pain in right shoulder: Secondary | ICD-10-CM | POA: Diagnosis not present

## 2021-11-30 DIAGNOSIS — M6281 Muscle weakness (generalized): Secondary | ICD-10-CM

## 2021-11-30 NOTE — Therapy (Signed)
OUTPATIENT PHYSICAL THERAPY SHOULDER TREATMENT   Patient Name: Steven Chan MRN: [id number] DOB:Mar 17, 1995, 25 y.o., male Today's Date: 11/30/2021   PT End of Session - 11/30/21 1156     Visit Number 3    Number of Visits 24    Date for PT Re-Evaluation 02/12/22    Authorization Type UHC    PT Start Time 1152    PT Stop Time 1230    PT Time Calculation (min) 38 min    Activity Tolerance Patient tolerated treatment well    Behavior During Therapy [facility] for tasks assessed/performed              Past Medical History:  Diagnosis Date   Anxiety    COVID 07/2019   moderate case - 2 weeks with symptoms   Depression    Seasonal allergies    Past Surgical History:  Procedure Laterality Date   MYRINGOTOMY     SHOULDER ARTHROSCOPY WITH LABRAL REPAIR Right 10/20/2021   Procedure: RIGHT SHOULDER POSTERIOR LABRAL TEAR ARTHROSCOPIC;  Surgeon: Cammy Copa, MD;  Location: [facility] OR;  Service: Orthopedics;  Laterality: Right;   WISDOM TOOTH EXTRACTION     Patient Active Problem List   Diagnosis Date Noted   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     PCP: Gildardo Cranker  REFERRING PROVIDER: Cammy Copa, MD   REFERRING DIAG: M25.311 (ICD-10-CM) - Instability of right shoulder joint   THERAPY DIAG:  Acute pain of right shoulder  Stiffness of right shoulder, not elsewhere classified  Muscle weakness (generalized)  Scapular dyskinesis  Other symptoms and signs involving the musculoskeletal system  Rationale for Evaluation and Treatment Rehabilitation  ONSET DATE: 10/20/2021  SUBJECTIVE:                                                                                                                                                                                      SUBJECTIVE STATEMENT: The patient notes sleeping and driving are still painful.   PERTINENT HISTORY: 10/20/21 right shoulder arthroscopy with anterior and posterior  labral repair; history of multiple shoulder dislocations R&L  PAIN:  Are you having pain? Yes: NPRS scale: 3/10 Pain location: Shoulder, notes some discomfort when driving Pain description: soreness Aggravating factors: driving Relieving factors: positioning  PRECAUTIONS: "Ok for ABD to 90, OK for ER to 40 degrees, no IR, no post capsular stretching, no FF x 2 weeks, then ok for FF to 90 degrees"  FALLS:  Has patient fallen in last 6 months? No  PATIENT GOALS Improve shoulder ROM, return to full activity (rides motorcycle)  OBJECTIVE:  TREATMENT TODAY: 11/30/21: Seated  A/AROM using dowel into  ER (elbow at 90 and maintained at neutral) A/AROM using dowel into abduction Seated neck stretching upper trap and levator  Standing Elbow flexion/extension with dowel rod Elbow pronation/supination x 10 reps  Supine PROM ER and abduction within tolerable ROM x 10 reps x 2 sets in each direction Gentle muscle relaxation with scapular and pec pressure A/AROM ER and abduction with dowel x 10 reps x 2 sets Scapular retraction x 10 reps with palms up Chin tucks x 10 reps  11/25/21 THEREX Supine  PROM abduction * see ROM measurements below PROM ER with towel under distal elbow Elbow flexion/extension AROM Scapular retraction x 5 reps  Sidelying Scapular retraction x 10 reps with tactile cues and PT supporting R UE  SELF Discussed positioning for sleeping to support the R UE  ________________________________________________________________________________________________________________ From eval PATIENT SURVEYS:  FOTO 38; predicted 69 visit 17  UPPER EXTREMITY ROM:   Active ROM Right eval Left eval Right 11/25/21 Right 11/30/21  Shoulder flexion NT 190 AROM Not able   Shoulder extension NT 56 AROM    Shoulder abduction 63 AROM 70 PROM 180 AROM 74 PROM supine 85 AAROM supine  Shoulder adduction      Shoulder internal rotation NT To T3    Shoulder external rotation  15 AROM * 30 PROM 75 AROM * 32 PROM Supine with arm at neutral 33 PROM Supine arm at neutral with elbow at 90  Elbow flexion      Elbow extension      (Blank rows = not tested) * = seated with elbow by side  UPPER EXTREMITY MMT: Did not fully assess R due to acuity of surgery, MMT limited due to precautions and ROM. Observed to be at least the following:  MMT Right eval Left eval  Shoulder flexion  5  Shoulder extension  5  Shoulder abduction 2 5  Shoulder adduction    Shoulder internal rotation  5  Shoulder external rotation 2 5  Middle trapezius 3 5  Lower trapezius 3 5  Elbow flexion 5 5  Elbow extension 5 5  Grip strength (lbs) 115, 110, 105 120, 105, 95  (Blank rows = not tested)  _______________________________________________________________________________________________________________________________________   PATIENT EDUCATION: Education details: continuation of HEP Person educated: Patient Education method: Programmer, multimedia, Demonstration, and Handouts Education comprehension: verbalized understanding and returned demonstration   HOME EXERCISE PROGRAM: Access Code: [email or web address] URL: [email or web address] Date: 11/30/2021 Prepared by: Margretta Ditty  Exercises - Seated Cervical Retraction  - 2 x daily - 7 x weekly - 1 sets - 10 reps - Supine Shoulder External Rotation with Dowel  - 1 x daily - 7 x weekly - 1 sets - 10 reps - 3 sec hold - Supine Shoulder Abduction AAROM with Dowel  - 1 x daily - 7 x weekly - 1 sets - 10 reps - 3 sec hold - Standing Scapular Retraction  - 2 x daily - 7 x weekly - 1 sets - 10 reps - Standing Elbow Flexion Extension AROM  - 2 x daily - 7 x weekly - 1 sets - 10 reps - Standing Forearm Pronation and Supination AROM  - 2 x daily - 7 x weekly - 1 sets - 10 reps  ASSESSMENT:  CLINICAL IMPRESSION: The patient tolerated ROM well. Plan to continue to work towards STGs/LTGs. Can progress forward flexion next visit per MD  note.   OBJECTIVE IMPAIRMENTS decreased mobility, decreased ROM, decreased strength, hypomobility, increased fascial restrictions, increased muscle spasms, impaired UE functional use, improper body  mechanics, and postural dysfunction.   ACTIVITY LIMITATIONS carrying, lifting, toileting, dressing, reach over head, and hygiene/grooming  PARTICIPATION LIMITATIONS: meal prep, cleaning, laundry, community activity, and yard work  GOALS: Goals reviewed with patient? Yes  SHORT TERM GOALS: Target date: 01/01/2022   Pt will be ind with initial HEP Baseline: Goal status: INITIAL  2.  Pt will be able to obtain at least 90 deg of shoulder flexion and abd for shoulder elevation Baseline:  Goal status: INITIAL  3.  Pt will be able to obtain at least 40 deg of shoulder ER for improved shoulder ROM Baseline:  Goal status: INITIAL   LONG TERM GOALS: Target date: 02/12/2022    Pt will be ind with maintaining and progressing HEP Baseline:  Goal status: INITIAL  2.  Pt will demo L = R shoulder AROM Baseline:  Goal status: INITIAL  3.  Pt will be able to lift and carry at least 25# for IADLs and work tasks Baseline:  Goal status: INITIAL  4.  Pt will be able to lift at least 5# overhead to reach into cabinets Baseline:  Goal status: INITIAL  5.  Pt will improve FOTO score to >/= 69 Baseline:  Goal status: INITIAL  PLAN: PT FREQUENCY: 2x/week  PT DURATION: 12 weeks  PLANNED INTERVENTIONS: Therapeutic exercises, Therapeutic activity, Neuromuscular re-education, Balance training, Gait training, Patient/Family education, Self Care, Joint mobilization, Dry Needling, Electrical stimulation, Cryotherapy, Moist heat, scar mobilization, Taping, Vasopneumatic device, Ultrasound, Ionotophoresis 4mg /ml Dexamethasone, Manual therapy, and Re-evaluation  PLAN FOR NEXT SESSION: Add forward flexion PROM, pendulum, and A/AROM; Review HEP. Continue to work on improving shoulder ROM within precautions.  Gentle isometrics and scapular strengthening   Allysson Rinehimer, PT 11/30/2021, 11:56 AM

## 2021-12-02 ENCOUNTER — Ambulatory Visit: Payer: 59 | Admitting: Rehabilitative and Restorative Service Providers"

## 2021-12-02 ENCOUNTER — Encounter: Payer: Self-pay | Admitting: Rehabilitative and Restorative Service Providers"

## 2021-12-02 DIAGNOSIS — G2589 Other specified extrapyramidal and movement disorders: Secondary | ICD-10-CM

## 2021-12-02 DIAGNOSIS — M25611 Stiffness of right shoulder, not elsewhere classified: Secondary | ICD-10-CM

## 2021-12-02 DIAGNOSIS — M25511 Pain in right shoulder: Secondary | ICD-10-CM

## 2021-12-02 DIAGNOSIS — M6281 Muscle weakness (generalized): Secondary | ICD-10-CM

## 2021-12-02 NOTE — Therapy (Signed)
OUTPATIENT PHYSICAL THERAPY SHOULDER TREATMENT   Patient Name: Steven Chan MRN: [id number] DOB:1995-07-24, 26 y.o., male Today's Date: 12/02/2021   PT End of Session - 12/02/21 1102     Visit Number 4    Number of Visits 24    Date for PT Re-Evaluation 02/12/22    Authorization Type U[employer]    PT Start Time 1102    PT Stop Time 1145    PT Time Calculation (min) 43 min    Activity Tolerance Patient tolerated treatment well    Behavior During Therapy [facility] for tasks assessed/performed               Past Medical History:  Diagnosis Date   Anxiety    COVID 07/2019   moderate case - 2 weeks with symptoms   Depression    Seasonal allergies    Past Surgical History:  Procedure Laterality Date   MYRINGOTOMY     SHOULDER ARTHROSCOPY WITH LABRAL REPAIR Right 10/20/2021   Procedure: RIGHT SHOULDER POSTERIOR LABRAL TEAR ARTHROSCOPIC;  Surgeon: Meredith Pel, MD;  Location: [place];  Service: Orthopedics;  Laterality: Right;   WISDOM TOOTH EXTRACTION     Patient Active Problem List   Diagnosis Date Noted   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     PCP: Lona Kettle  REFERRING PROVIDER: Meredith Pel, MD   REFERRING DIAG: M25.311 (ICD-10-CM) - Instability of right shoulder joint   THERAPY DIAG:  Acute pain of right shoulder  Stiffness of right shoulder, not elsewhere classified  Muscle weakness (generalized)  Scapular dyskinesis  Rationale for Evaluation and Treatment Rehabilitation  ONSET DATE: 10/20/2021  SUBJECTIVE:                                                                                                                                                                                      SUBJECTIVE STATEMENT: The patient notes tightness this morning. No increase in pain after therapy last session.  PERTINENT HISTORY: 10/20/21 right shoulder arthroscopy with anterior and posterior labral repair; history of multiple  shoulder dislocations R&L  PAIN:  Are you having pain? Yes: NPRS scale: 2/10 Pain location: Shoulder, notes some discomfort when driving Pain description: soreness Aggravating factors: driving Relieving factors: positioning  PRECAUTIONS: "Ok for ABD to 90, OK for ER to 40 degrees, no IR, no post capsular stretching, no FF x 2 weeks, then ok for FF to 90 degrees"  FALLS:  Has patient fallen in last 6 months? No  PATIENT GOALS Improve shoulder ROM, return to full activity (rides motorcycle)  OBJECTIVE:  [place] Adult PT Treatment:  DATE: 12/02/21 Therapeutic Exercise: Standing scapular retraction x 10 with cues for upright posture AAROM ER with dowel x 10 standing and supine AAROM flexion with dowel x 10standing and supine AAROM abduction with dowel x 10standing and supine Standing upper trap and levator stretch Gentle muscle relaxation with scapular and pec pressure  Manual Therapy: STM R upper trap, pectoralis gentle STM  11/30/21: Seated  A/AROM using dowel into ER (elbow at 90 and maintained at neutral) A/AROM using dowel into abduction Seated neck stretching upper trap and levator  Standing Elbow flexion/extension with dowel rod Elbow pronation/supination x 10 reps  Supine PROM ER and abduction within tolerable ROM x 10 reps x 2 sets in each direction Gentle muscle relaxation with scapular and pec pressure A/AROM ER and abduction with dowel x 10 reps x 2 sets Scapular retraction x 10 reps with palms up Chin tucks x 10 reps  11/25/21 THEREX Supine  PROM abduction * see ROM measurements below PROM ER with towel under distal elbow Elbow flexion/extension AROM Scapular retraction x 5 reps  Sidelying Scapular retraction x 10 reps with tactile cues and PT supporting R UE  SELF Discussed positioning for sleeping to support the R  UE  ________________________________________________________________________________________________________________ From eval PATIENT SURVEYS:  FOTO 38; predicted 69 visit 17  UPPER EXTREMITY ROM:   Active ROM Right eval Left eval Right 11/25/21 Right 11/30/21 Right 12/02/21  Shoulder flexion NT 190 AROM Not able  85 AAROM supine  Shoulder extension NT 56 AROM   NT  Shoulder abduction 63 AROM 70 PROM 180 AROM 74 PROM supine 85 AAROM supine 88 AAROM supine  Shoulder adduction       Shoulder internal rotation NT To T3     Shoulder external rotation 15 AROM * 30 PROM 75 AROM * 32 PROM Supine with arm at neutral 33 PROM Supine arm at neutral with elbow at 90 35 AAROM Supine arm at neutral with elbow at 90  Elbow flexion       Elbow extension       (Blank rows = not tested) * = seated with elbow by side  UPPER EXTREMITY MMT: Did not fully assess R due to acuity of surgery, MMT limited due to precautions and ROM. Observed to be at least the following:  MMT Right eval Left eval  Shoulder flexion  5  Shoulder extension  5  Shoulder abduction 2 5  Shoulder adduction    Shoulder internal rotation  5  Shoulder external rotation 2 5  Middle trapezius 3 5  Lower trapezius 3 5  Elbow flexion 5 5  Elbow extension 5 5  Grip strength (lbs) 115, 110, 105 120, 105, 95  (Blank rows = not tested)  _______________________________________________________________________________________________________________________________________   PATIENT EDUCATION: Education details: continuation of HEP Person educated: Patient Education method: Consulting civil engineer, Demonstration, and Handouts Education comprehension: verbalized understanding and returned demonstration   HOME EXERCISE PROGRAM: Access Code: [email or web address] URL: [email or web address] Date: 12/02/2021 Prepared by: Rudell Cobb  Exercises - Seated Cervical Retraction  - 2 x daily - 7 x weekly - 1 sets - 10 reps -  Standing Scapular Retraction  - 2 x daily - 7 x weekly - 1 sets - 10 reps - Standing Elbow Flexion Extension AROM  - 2 x daily - 7 x weekly - 1 sets - 10 reps - Standing Forearm Pronation and Supination AROM  - 2 x daily - 7 x weekly - 1 sets - 10 reps - Flexion-Extension Shoulder Pendulum with Table Support  -  2 x daily - 7 x weekly - 1 sets - 10 reps - Standing Upper Trapezius Stretch  - 2 x daily - 7 x weekly - 1 sets - 10 reps - Supine Shoulder External Rotation with Dowel  - 1 x daily - 7 x weekly - 1 sets - 10 reps - 3 sec hold - Supine Shoulder Abduction AAROM with Dowel  - 1 x daily - 7 x weekly - 1 sets - 10 reps - 3 sec hold - Supine Shoulder Press with Dowel  - 2 x daily - 7 x weekly - 1 sets - 10 reps - Supine Shoulder Flexion Extension AAROM with Dowel  - 2 x daily - 7 x weekly - 1 sets - 10 reps ASSESSMENT:  CLINICAL IMPRESSION: The patient tolerated AAROM and PROM into shoulder flexion, ER and abduction well. Plan to continue to work towards STGs/LTGs.  Has f/u with MD next week.  OBJECTIVE IMPAIRMENTS decreased mobility, decreased ROM, decreased strength, hypomobility, increased fascial restrictions, increased muscle spasms, impaired UE functional use, improper body mechanics, and postural dysfunction.   ACTIVITY LIMITATIONS carrying, lifting, toileting, dressing, reach over head, and hygiene/grooming  PARTICIPATION LIMITATIONS: meal prep, cleaning, laundry, community activity, and yard work  GOALS: Goals reviewed with patient? Yes  SHORT TERM GOALS: Target date: 01/01/2022   Pt will be ind with initial HEP Baseline: Goal status: IN PROGRESS  2.  Pt will be able to obtain at least 90 deg of shoulder flexion and abd for shoulder elevation Baseline:  Goal status: IN PROGRESS  3.  Pt will be able to obtain at least 40 deg of shoulder ER for improved shoulder ROM Baseline:  Goal status:IN PROGRESS   LONG TERM GOALS: Target date: 02/12/2022    Pt will be ind with  maintaining and progressing HEP Baseline:  Goal status:IN PROGRESS  2.  Pt will demo L = R shoulder AROM Baseline:  Goal status: IN PROGRESS  3.  Pt will be able to lift and carry at least 25# for IADLs and work tasks Baseline:  Goal status: IN PROGRESS  4.  Pt will be able to lift at least 5# overhead to reach into cabinets Baseline:  Goal status: IN PROGRESS  5.  Pt will improve FOTO score to >/= 69 Baseline:  Goal status: IN PROGRESS PLAN: PT FREQUENCY: 2x/week  PT DURATION: 12 weeks  PLANNED INTERVENTIONS: Therapeutic exercises, Therapeutic activity, Neuromuscular re-education, Balance training, Gait training, Patient/Family education, Self Care, Joint mobilization, Dry Needling, Electrical stimulation, Cryotherapy, Moist heat, scar mobilization, Taping, Vasopneumatic device, Ultrasound, Ionotophoresis 4mg /ml Dexamethasone, Manual therapy, and Re-evaluation  PLAN FOR NEXT SESSION: pendulum, and A/AROM; Review HEP. Continue to work on improving shoulder ROM within precautions. Gentle isometrics and scapular strengthening   Tambria Pfannenstiel, PT 12/02/2021, 11:02 AM

## 2021-12-07 ENCOUNTER — Ambulatory Visit: Payer: 59 | Admitting: Rehabilitative and Restorative Service Providers"

## 2021-12-07 DIAGNOSIS — G2589 Other specified extrapyramidal and movement disorders: Secondary | ICD-10-CM

## 2021-12-07 DIAGNOSIS — M6281 Muscle weakness (generalized): Secondary | ICD-10-CM

## 2021-12-07 DIAGNOSIS — R29898 Other symptoms and signs involving the musculoskeletal system: Secondary | ICD-10-CM

## 2021-12-07 DIAGNOSIS — M25511 Pain in right shoulder: Secondary | ICD-10-CM

## 2021-12-07 DIAGNOSIS — M25611 Stiffness of right shoulder, not elsewhere classified: Secondary | ICD-10-CM

## 2021-12-07 NOTE — Therapy (Signed)
OUTPATIENT PHYSICAL THERAPY SHOULDER TREATMENT   Patient Name: Steven Chan MRN: [id number] DOB:1995/07/04, 26 y.o., male Today's Date: 12/07/2021   PT End of Session - 12/07/21 0722     Visit Number 5    Number of Visits 24    Date for PT Re-Evaluation 02/12/22    Authorization Type [employer]    PT Start Time 0720    PT Stop Time 0800    PT Time Calculation (min) 40 min    Activity Tolerance Patient tolerated treatment well               Past Medical History:  Diagnosis Date   Anxiety    COVID 07/2019   moderate case - 2 weeks with symptoms   Depression    Seasonal allergies    Past Surgical History:  Procedure Laterality Date   MYRINGOTOMY     SHOULDER ARTHROSCOPY WITH LABRAL REPAIR Right 10/20/2021   Procedure: RIGHT SHOULDER POSTERIOR LABRAL TEAR ARTHROSCOPIC;  Surgeon: Meredith Pel, MD;  Location: [place];  Service: Orthopedics;  Laterality: Right;   WISDOM TOOTH EXTRACTION     Patient Active Problem List   Diagnosis Date Noted   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     PCP: Lona Kettle  REFERRING PROVIDER: Meredith Pel, MD   REFERRING DIAG: M25.311 (ICD-10-CM) - Instability of right shoulder joint   THERAPY DIAG:  Acute pain of right shoulder  Stiffness of right shoulder, not elsewhere classified  Muscle weakness (generalized)  Scapular dyskinesis  Other symptoms and signs involving the musculoskeletal system  Rationale for Evaluation and Treatment Rehabilitation  ONSET DATE: 10/20/2021  SUBJECTIVE:                                                                                                                                                                                      SUBJECTIVE STATEMENT: The patient notes tightness and discomfort when he awakens in the morning. Working on his exercises at home.  PERTINENT HISTORY: 10/20/21 right shoulder arthroscopy with anterior and posterior labral repair;  history of multiple shoulder dislocations R&L  PAIN:  Are you having pain? Yes: NPRS scale: 2/10 Pain location: Shoulder, notes some discomfort when driving Pain description: soreness Aggravating factors: driving Relieving factors: positioning  PRECAUTIONS: "Ok for ABD to 90, OK for ER to 40 degrees, no IR, no post capsular stretching, no FF x 2 weeks, then ok for FF to 90 degrees"  FALLS:  Has patient fallen in last 6 months? No  PATIENT GOALS Improve shoulder ROM, return to full activity (rides motorcycle)  OBJECTIVE:  [place] Adult PT Treatment:  DATE: 12/07/21 Therapeutic Exercise: Standing scapular retraction x 10 with cues for upright posture x 2 sets Chin tuck x 10 10 sec hold x 2 sets AAROM ER with dowel x 10 standing and supine  AAROM flexion with dowel x 10 standing and supine AAROM abduction with dowel x 10 standing and supine Standing upper trap and levator stretch Gentle muscle relaxation with scapular and pec pressure  Manual Therapy: STM cervical spine musculature; Rt upper trap, pectoralis, teres/lats. PROM within limits per MD referral  ________________________________________________________________________________________________________________ From eval PATIENT SURVEYS:  FOTO 38; predicted 69 visit 17  UPPER EXTREMITY ROM:   Active ROM Right eval Left eval Right 11/25/21 Right 11/30/21 Right 12/02/21 Right 12/07/21  Shoulder flexion NT 190 AROM Not able  85 AAROM supine 90 AAROM supine  Shoulder extension NT 56 AROM   NT N  Shoulder abduction 63 AROM 70 PROM 180 AROM 74 PROM supine 85 AAROM supine 88 AAROM supine 90 AAROM supine  Shoulder adduction        Shoulder internal rotation NT To T3      Shoulder external rotation 15 AROM * 30 PROM 75 AROM * 32 PROM Supine with arm at neutral 33 PROM Supine arm at neutral with elbow at 90 35 AAROM Supine arm at neutral with elbow at 90 40 AAROM supine in  scapular plane elbow 90   Elbow flexion        Elbow extension        (Blank rows = not tested) * = seated with elbow by side  UPPER EXTREMITY MMT: Did not fully assess R due to acuity of surgery, MMT limited due to precautions and ROM. Observed to be at least the following:  MMT Right eval Left eval  Shoulder flexion  5  Shoulder extension  5  Shoulder abduction 2 5  Shoulder adduction    Shoulder internal rotation  5  Shoulder external rotation 2 5  Middle trapezius 3 5  Lower trapezius 3 5  Elbow flexion 5 5  Elbow extension 5 5  Grip strength (lbs) 115, 110, 105 120, 105, 95  (Blank rows = not tested)   12/07/21: Palpation; muscular tightness noted through cervical spine musculature and Rt shoulder girdle _______________________________________________________________________________________________________________________________________   PATIENT EDUCATION: Education details: continuation of HEP Person educated: Patient Education method: Programmer, multimedia, Facilities manager, and Handouts Education comprehension: verbalized understanding and returned demonstration   HOME EXERCISE PROGRAM: Access Code: [email or web address] URL: [email or web address] Date: 12/02/2021 Prepared by: Margretta Ditty  Exercises - Seated Cervical Retraction  - 2 x daily - 7 x weekly - 1 sets - 10 reps - Standing Scapular Retraction  - 2 x daily - 7 x weekly - 1 sets - 10 reps - Standing Elbow Flexion Extension AROM  - 2 x daily - 7 x weekly - 1 sets - 10 reps - Standing Forearm Pronation and Supination AROM  - 2 x daily - 7 x weekly - 1 sets - 10 reps - Flexion-Extension Shoulder Pendulum with Table Support  - 2 x daily - 7 x weekly - 1 sets - 10 reps - Standing Upper Trapezius Stretch  - 2 x daily - 7 x weekly - 1 sets - 10 reps - Supine Shoulder External Rotation with Dowel  - 1 x daily - 7 x weekly - 1 sets - 10 reps - 3 sec hold - Supine Shoulder Abduction AAROM with Dowel  - 1 x daily - 7  x weekly - 1 sets - 10 reps -  3 sec hold - Supine Shoulder Press with Dowel  - 2 x daily - 7 x weekly - 1 sets - 10 reps - Supine Shoulder Flexion Extension AAROM with Dowel  - 2 x daily - 7 x weekly - 1 sets - 10 reps ASSESSMENT:  CLINICAL IMPRESSION: The patient progressing well within stated restrictions per referral. Tolerates AAROM and PROM into shoulder flexion, ER and abduction well. Plan to continue to work towards STGs/LTGs.  Note to MD  OBJECTIVE IMPAIRMENTS decreased mobility, decreased ROM, decreased strength, hypomobility, increased fascial restrictions, increased muscle spasms, impaired UE functional use, improper body mechanics, and postural dysfunction.   ACTIVITY LIMITATIONS carrying, lifting, toileting, dressing, reach over head, and hygiene/grooming  PARTICIPATION LIMITATIONS: meal prep, cleaning, laundry, community activity, and yard work  GOALS: Goals reviewed with patient? Yes  SHORT TERM GOALS: Target date: 01/01/2022   Pt will be ind with initial HEP Baseline: Goal status: IN PROGRESS  2.  Pt will be able to obtain at least 90 deg of shoulder flexion and abd for shoulder elevation Baseline:  Goal status: IN PROGRESS  3.  Pt will be able to obtain at least 40 deg of shoulder ER for improved shoulder ROM Baseline:  Goal status:IN PROGRESS   LONG TERM GOALS: Target date: 02/12/2022    Pt will be ind with maintaining and progressing HEP Baseline:  Goal status:IN PROGRESS  2.  Pt will demo L = R shoulder AROM Baseline:  Goal status: IN PROGRESS  3.  Pt will be able to lift and carry at least 25# for IADLs and work tasks Baseline:  Goal status: IN PROGRESS  4.  Pt will be able to lift at least 5# overhead to reach into cabinets Baseline:  Goal status: IN PROGRESS  5.  Pt will improve FOTO score to >/= 69 Baseline:  Goal status: IN PROGRESS PLAN: PT FREQUENCY: 2x/week  PT DURATION: 12 weeks  PLANNED INTERVENTIONS: Therapeutic exercises,  Therapeutic activity, Neuromuscular re-education, Balance training, Gait training, Patient/Family education, Self Care, Joint mobilization, Dry Needling, Electrical stimulation, Cryotherapy, Moist heat, scar mobilization, Taping, Vasopneumatic device, Ultrasound, Ionotophoresis 4mg /ml Dexamethasone, Manual therapy, and Re-evaluation  PLAN FOR NEXT SESSION: pendulum, and A/AROM; Review HEP. Continue to work on improving shoulder ROM within precautions. Gentle isometrics and scapular strengthening   Irving Lubbers , PT, MPH  12/07/2021, 7:57 AM

## 2021-12-09 ENCOUNTER — Encounter: Payer: Self-pay | Admitting: Orthopedic Surgery

## 2021-12-09 ENCOUNTER — Ambulatory Visit: Payer: 59 | Attending: Orthopedic Surgery | Admitting: Rehabilitative and Restorative Service Providers"

## 2021-12-09 ENCOUNTER — Ambulatory Visit (INDEPENDENT_AMBULATORY_CARE_PROVIDER_SITE_OTHER): Payer: 59 | Admitting: Orthopedic Surgery

## 2021-12-09 ENCOUNTER — Encounter: Payer: Self-pay | Admitting: Rehabilitative and Restorative Service Providers"

## 2021-12-09 DIAGNOSIS — R29898 Other symptoms and signs involving the musculoskeletal system: Secondary | ICD-10-CM | POA: Diagnosis present

## 2021-12-09 DIAGNOSIS — M25311 Other instability, right shoulder: Secondary | ICD-10-CM

## 2021-12-09 DIAGNOSIS — M6281 Muscle weakness (generalized): Secondary | ICD-10-CM | POA: Diagnosis present

## 2021-12-09 DIAGNOSIS — G2589 Other specified extrapyramidal and movement disorders: Secondary | ICD-10-CM | POA: Diagnosis present

## 2021-12-09 DIAGNOSIS — M25511 Pain in right shoulder: Secondary | ICD-10-CM | POA: Insufficient documentation

## 2021-12-09 DIAGNOSIS — M25611 Stiffness of right shoulder, not elsewhere classified: Secondary | ICD-10-CM | POA: Insufficient documentation

## 2021-12-09 NOTE — Therapy (Signed)
OUTPATIENT PHYSICAL THERAPY SHOULDER TREATMENT   Patient Name: Steven Chan MRN: [id number] DOB:11-Jun-1995, 26 y.o., male Today's Date: 12/09/2021   PT End of Session - 12/09/21 1153     Visit Number 6    Number of Visits 24    Date for PT Re-Evaluation 02/12/22    Authorization Type U[employer]    PT Start Time 1152    PT Stop Time 1232    PT Time Calculation (min) 40 min    Activity Tolerance Patient tolerated treatment well    Behavior During Therapy [facility] for tasks assessed/performed               Past Medical History:  Diagnosis Date   Anxiety    COVID 07/2019   moderate case - 2 weeks with symptoms   Depression    Seasonal allergies    Past Surgical History:  Procedure Laterality Date   MYRINGOTOMY     SHOULDER ARTHROSCOPY WITH LABRAL REPAIR Right 10/20/2021   Procedure: RIGHT SHOULDER POSTERIOR LABRAL TEAR ARTHROSCOPIC;  Surgeon: Cammy Copa, MD;  Location: [facility];  Service: Orthopedics;  Laterality: Right;   WISDOM TOOTH EXTRACTION     Patient Active Problem List   Diagnosis Date Noted   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     PCP: Gildardo Cranker  REFERRING PROVIDER: Cammy Copa, MD   REFERRING DIAG: M25.311 (ICD-10-CM) - Instability of right shoulder joint   THERAPY DIAG:  Acute pain of right shoulder  Stiffness of right shoulder, not elsewhere classified  Muscle weakness (generalized)  Scapular dyskinesis  Other symptoms and signs involving the musculoskeletal system  Rationale for Evaluation and Treatment Rehabilitation  ONSET DATE: 10/20/2021  SUBJECTIVE:                                                                                                                                                                                      SUBJECTIVE STATEMENT: The patient saw MD and got new orders (scanned in chart).  He can begin strengthening.   PERTINENT HISTORY: 10/20/21 right shoulder arthroscopy  with anterior and posterior labral repair; history of multiple shoulder dislocations R&L  PAIN:  Are you having pain? Yes: NPRS scale: 2/10 Pain location: Shoulder, notes some discomfort when driving Pain description: soreness Aggravating factors: driving Relieving factors: positioning  PRECAUTIONS: "Ok for ABD to 90, OK for ER to 40 degrees, no IR, no post capsular stretching, no FF x 2 weeks, then ok for FF to 90 degrees" UPDATE ON 12/09/21:   ff >90, abd >90, er <60, no posterior capsule stretching, ok for RC strengthening and HEP  FALLS:  Has patient fallen  in last 6 months? No  PATIENT GOALS Improve shoulder ROM, return to full activity (rides motorcycle)  OBJECTIVE:  [facility] Adult PT Treatment:                                                DATE: 12/09/21 Therapeutic Exercise: Warm up with AAROM using pulleys into flexion and scaption Modified HEP to include AAROM >90 flexion AAROM abduction >90 AAROM ER to 48 degrees Isometrics supine for extension and adduction 12 reps x 3 second holds Isometrics flexion, abduction, IR/ER in standing 12 rep x 3 second holds  Manual Therapy: PROM flexion, abduction, ER, scaption Gentle rocking for grade I distraction   [facility] Adult PT Treatment:                                                DATE: 12/07/21 Therapeutic Exercise: Standing scapular retraction x 10 with cues for upright posture x 2 sets Chin tuck x 10 10 sec hold x 2 sets AAROM ER with dowel x 10 standing and supine  AAROM flexion with dowel x 10 standing and supine AAROM abduction with dowel x 10 standing and supine Standing upper trap and levator stretch Gentle muscle relaxation with scapular and pec pressure  Manual Therapy: STM cervical spine musculature; Rt upper trap, pectoralis, teres/lats. PROM within limits per MD referral  ________________________________________________________________________________________________________________ From eval PATIENT SURVEYS:   FOTO 38; predicted 69 visit 17  UPPER EXTREMITY ROM:   Active ROM Right eval Left eval Right 11/25/21 Right 11/30/21 Right 12/02/21 Right 12/07/21  Shoulder flexion NT 190 AROM Not able  85 AAROM supine 90 AAROM supine  Shoulder extension NT 56 AROM   NT N  Shoulder abduction 63 AROM 70 PROM 180 AROM 74 PROM supine 85 AAROM supine 88 AAROM supine 90 AAROM supine  Shoulder adduction        Shoulder internal rotation NT To T3      Shoulder external rotation 15 AROM * 30 PROM 75 AROM * 32 PROM Supine with arm at neutral 33 PROM Supine arm at neutral with elbow at 90 35 AAROM Supine arm at neutral with elbow at 90 40 AAROM supine in scapular plane elbow 90   Elbow flexion        Elbow extension        (Blank rows = not tested) * = seated with elbow by side  UPPER EXTREMITY MMT: Did not fully assess R due to acuity of surgery, MMT limited due to precautions and ROM. Observed to be at least the following:  MMT Right eval Left eval  Shoulder flexion  5  Shoulder extension  5  Shoulder abduction 2 5  Shoulder adduction    Shoulder internal rotation  5  Shoulder external rotation 2 5  Middle trapezius 3 5  Lower trapezius 3 5  Elbow flexion 5 5  Elbow extension 5 5  Grip strength (lbs) 115, 110, 105 120, 105, 95  (Blank rows = not tested)   12/07/21: Palpation; muscular tightness noted through cervical spine musculature and Rt shoulder girdle _______________________________________________________________________________________________________________________________________   PATIENT EDUCATION: Education details: continuation of HEP Person educated: Patient Education method: Explanation, Demonstration, and Handouts Education comprehension: verbalized understanding and returned demonstration  HOME EXERCISE PROGRAM: Access Code: [email or web address] URL: [email or web address] Date: 12/09/2021 Prepared by: Rudell Cobb  Exercises - Standing Upper  Trapezius Stretch  - 2 x daily - 7 x weekly - 1 sets - 10 reps - Supine Shoulder External Rotation with Dowel  - 1 x daily - 7 x weekly - 1 sets - 10 reps - 3 sec hold - Supine Shoulder Abduction AAROM with Dowel  - 1 x daily - 7 x weekly - 1 sets - 10 reps - 3 sec hold - Supine Shoulder Flexion Extension AAROM with Dowel  - 2 x daily - 7 x weekly - 1 sets - 10 reps - Supine Isometric Shoulder Extension with Towel  - 2 x daily - 7 x weekly - 1 sets - 10-12 reps - 3 seconds hold - Supine Isometric Shoulder Adduction with Towel Roll  - 2 x daily - 7 x weekly - 1 sets - 10 reps - Prone Scapular Retraction  - 2 x daily - 7 x weekly - 1 sets - 10-12 reps - 3 seconds hold - Isometric Shoulder Flexion at Wall  - 2 x daily - 7 x weekly - 1 sets - 10-12 reps - 3 seconds hold - Standing Isometric Shoulder Internal Rotation at Doorway  - 2 x daily - 7 x weekly - 1 sets - 10-12 reps - 3 seconds hold - Standing Isometric Shoulder External Rotation with Doorway  - 2 x daily - 7 x weekly - 1 sets - 10-12 reps - 3 seconds hold - Standing Isometric Shoulder Abduction with Doorway - Arm Bent  - 2 x daily - 7 x weekly - 1 sets - 10-12 reps - 3 seconds hold ASSESSMENT:  CLINICAL IMPRESSION: The patient tolerated addition of isometrics at neutral to HEP.  We also progressed A/AROM to >90 with dowel for forward flexion and abduction.  Patient has greater comfort with towel roll under distal humerus and doing ER in plane of scapula.  Plan to continue to progress per MD recommendations and to patient/tissue tolerance.  OBJECTIVE IMPAIRMENTS decreased mobility, decreased ROM, decreased strength, hypomobility, increased fascial restrictions, increased muscle spasms, impaired UE functional use, improper body mechanics, and postural dysfunction.   GOALS: Goals reviewed with patient? Yes  SHORT TERM GOALS: Target date: 01/01/2022   Pt will be ind with initial HEP Baseline: Goal status: IN PROGRESS  2.  Pt will be  able to obtain at least 90 deg of shoulder flexion and abd for shoulder elevation Baseline:  Goal status: IN PROGRESS  3.  Pt will be able to obtain at least 40 deg of shoulder ER for improved shoulder ROM Baseline:  Goal status:IN PROGRESS   LONG TERM GOALS: Target date: 02/12/2022    Pt will be ind with maintaining and progressing HEP Baseline:  Goal status:IN PROGRESS  2.  Pt will demo L = R shoulder AROM Baseline:  Goal status: IN PROGRESS  3.  Pt will be able to lift and carry at least 25# for IADLs and work tasks Baseline:  Goal status: IN PROGRESS  4.  Pt will be able to lift at least 5# overhead to reach into cabinets Baseline:  Goal status: IN PROGRESS  5.  Pt will improve FOTO score to >/= 69 Baseline:  Goal status: IN PROGRESS PLAN: PT FREQUENCY: 2x/week  PT DURATION: 12 weeks  PLANNED INTERVENTIONS: Therapeutic exercises, Therapeutic activity, Neuromuscular re-education, Balance training, Gait training, Patient/Family education, Self Care, Joint mobilization, Dry Needling, Electrical stimulation, Cryotherapy, Moist  heat, scar mobilization, Taping, Vasopneumatic device, Ultrasound, Ionotophoresis 4mg /ml Dexamethasone, Manual therapy, and Re-evaluation  PLAN FOR NEXT SESSION: Gentle isometrics and scapular strengthening.  Progress A/ROM against gravity and progress to tolerance.   Anavi Branscum, PT 12/09/2021, 11:53 AM

## 2021-12-09 NOTE — Progress Notes (Signed)
   Post-Op Visit Note   Patient: Steven Chan           Date of Birth: 12/01/1995           MRN: [id number] Visit Date: 12/09/2021 PCP: Lawerance Cruel, MD   Assessment & Plan:  Chief Complaint:  Chief Complaint  Patient presents with   Right Shoulder - Routine Post Op    10/20/2021 right shoulder arthroscopy, labral repair      Visit Diagnoses: No diagnosis found.  Plan: Muhamad is a 26 year old patient 6 weeks out right shoulder anterior and primarily posterior labral repair.  Doing well with no particular problems.  Doing therapy 2 times a week.  No medications.  On examination he has range of motion today of 40/85/90.  Good stability.  Plan at this time is continue physical therapy and and rotator cuff strengthening.  Okay for forward flexion and abduction more than 90 degrees.  External rotation less than or equal to 60 degrees.  No posterior capsular stretching.  Therapy 2 times a week for 6 more weeks plus home exercise program and follow-up in 6 weeks for clinical recheck . prescription provided.  Follow-Up Instructions: No follow-ups on file.   Orders:  No orders of the defined types were placed in this encounter.  No orders of the defined types were placed in this encounter.   Imaging: No results found.  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    Past Medical History:  Diagnosis Date   Anxiety    COVID 07/2019   moderate case - 2 weeks with symptoms   Depression    Seasonal allergies     Family History  Problem Relation Age of Onset   Multiple sclerosis Mother     Past Surgical History:  Procedure Laterality Date   MYRINGOTOMY     SHOULDER ARTHROSCOPY WITH LABRAL REPAIR Right 10/20/2021   Procedure: RIGHT SHOULDER POSTERIOR LABRAL TEAR ARTHROSCOPIC;  Surgeon: Meredith Pel, MD;  Location: [place];  Service: Orthopedics;  Laterality: Right;   WISDOM TOOTH  EXTRACTION     Social History   Occupational History   Not on file  Tobacco Use   Smoking status: Never    Passive exposure: Never   Smokeless tobacco: Never  Vaping Use   Vaping Use: Never used  Substance and Sexual Activity   Alcohol use: No   Drug use: No   Sexual activity: Not on file

## 2021-12-14 ENCOUNTER — Encounter: Payer: Self-pay | Admitting: Rehabilitative and Restorative Service Providers"

## 2021-12-14 ENCOUNTER — Ambulatory Visit: Payer: 59 | Admitting: Rehabilitative and Restorative Service Providers"

## 2021-12-14 DIAGNOSIS — M6281 Muscle weakness (generalized): Secondary | ICD-10-CM

## 2021-12-14 DIAGNOSIS — M25511 Pain in right shoulder: Secondary | ICD-10-CM

## 2021-12-14 DIAGNOSIS — M25611 Stiffness of right shoulder, not elsewhere classified: Secondary | ICD-10-CM

## 2021-12-14 DIAGNOSIS — R29898 Other symptoms and signs involving the musculoskeletal system: Secondary | ICD-10-CM

## 2021-12-14 DIAGNOSIS — G2589 Other specified extrapyramidal and movement disorders: Secondary | ICD-10-CM

## 2021-12-14 NOTE — Therapy (Signed)
OUTPATIENT PHYSICAL THERAPY SHOULDER TREATMENT   Patient Name: Steven Chan MRN: [id number] DOB:10/06/1995, 26 y.o., male Today's Date: 12/14/2021   PT End of Session - 12/14/21 0722     Visit Number 7    Number of Visits 24    Date for PT Re-Evaluation 02/12/22    Authorization Type UHC    PT Start Time 9562    PT Stop Time 0800    PT Time Calculation (min) 43 min    Activity Tolerance Patient tolerated treatment well               Past Medical History:  Diagnosis Date   Anxiety    COVID 07/2019   moderate case - 2 weeks with symptoms   Depression    Seasonal allergies    Past Surgical History:  Procedure Laterality Date   MYRINGOTOMY     SHOULDER ARTHROSCOPY WITH LABRAL REPAIR Right 10/20/2021   Procedure: RIGHT SHOULDER POSTERIOR LABRAL TEAR ARTHROSCOPIC;  Surgeon: Cammy Copa, MD;  Location: [facility] OR;  Service: Orthopedics;  Laterality: Right;   WISDOM TOOTH EXTRACTION     Patient Active Problem List   Diagnosis Date Noted   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     PCP: Gildardo Cranker  REFERRING PROVIDER: Cammy Copa, MD   REFERRING DIAG: M25.311 (ICD-10-CM) - Instability of right shoulder joint   THERAPY DIAG:  Acute pain of right shoulder  Stiffness of right shoulder, not elsewhere classified  Muscle weakness (generalized)  Scapular dyskinesis  Other symptoms and signs involving the musculoskeletal system  Rationale for Evaluation and Treatment Rehabilitation  ONSET DATE: 10/20/2021  SUBJECTIVE:                                                                                                                                                                                      SUBJECTIVE STATEMENT: Steven Chan reports that he is doing well. No pain and exercises are going well.   PERTINENT HISTORY: 10/20/21 right shoulder arthroscopy with anterior and posterior labral repair; history of multiple shoulder  dislocations R&L  PAIN:  Are you having pain? Yes: NPRS scale: 0/10 Pain location: Shoulder, notes some discomfort when driving Pain description: soreness Aggravating factors: driving Relieving factors: positioning  PRECAUTIONS: "Ok for ABD to 90, OK for ER to 40 degrees, no IR, no post capsular stretching, no FF x 2 weeks, then ok for FF to 90 degrees" UPDATE ON 12/09/21:   ff >90, abd >90, er <60, no posterior capsule stretching, ok for RC strengthening and HEP  FALLS:  Has patient fallen in last 6 months? No  PATIENT GOALS Improve shoulder ROM, return  to full activity (rides motorcycle)  OBJECTIVE:   [facility] Adult PT Treatment:                                                DATE: 12/14/21 Therapeutic Exercise: AAROM using pulleys into flexion and scaption 10 sec hold x 10 PT assist to with scapular position  Isometrics supine for extension and adduction 12 reps x 3 second holds Isometrics flexion, abduction, IR/ER in standing 12 rep x 3 second holds Rhythmic stabilization supine flex/ext; horiz ab/ad; circles CW/CCW x 15-20 each  Row; ER; IR isometric step back red TB x 10 each   Manual work:  STM to pt tolerance; PROM Rt shoulder     [facility] Adult PT Treatment:                                                DATE: 12/09/21 Therapeutic Exercise: Warm up with AAROM using pulleys into flexion and scaption Modified HEP to include AAROM >90 flexion AAROM abduction >90 AAROM ER to 48 degrees Isometrics supine for extension and adduction 12 reps x 3 second holds Isometrics flexion, abduction, IR/ER in standing 12 rep x 3 second holds  Manual Therapy: PROM flexion, abduction, ER, scaption Gentle rocking for grade I distraction   [facility] Adult PT Treatment:                                                DATE: 12/07/21 Therapeutic Exercise: Standing scapular retraction x 10 with cues for upright posture x 2 sets Chin tuck x 10 10 sec hold x 2 sets AAROM ER with dowel x 10 standing and  supine  AAROM flexion with dowel x 10 standing and supine AAROM abduction with dowel x 10 standing and supine Standing upper trap and levator stretch Gentle muscle relaxation with scapular and pec pressure  Manual Therapy: STM cervical spine musculature; Rt upper trap, pectoralis, teres/lats. PROM within limits per MD referral  ________________________________________________________________________________________________________________ From eval PATIENT SURVEYS:  FOTO 38; predicted 69 visit 17  UPPER EXTREMITY ROM:   Active ROM Right eval Left eval Right 11/25/21 Right 11/30/21 Right 12/02/21 Right 12/07/21  Shoulder flexion NT 190 AROM Not able  85 AAROM supine 90 AAROM supine  Shoulder extension NT 56 AROM   NT N  Shoulder abduction 63 AROM 70 PROM 180 AROM 74 PROM supine 85 AAROM supine 88 AAROM supine 90 AAROM supine  Shoulder adduction        Shoulder internal rotation NT To T3      Shoulder external rotation 15 AROM * 30 PROM 75 AROM * 32 PROM Supine with arm at neutral 33 PROM Supine arm at neutral with elbow at 90 35 AAROM Supine arm at neutral with elbow at 90 40 AAROM supine in scapular plane elbow 90   Elbow flexion        Elbow extension        (Blank rows = not tested) * = seated with elbow by side  UPPER EXTREMITY MMT: Did not fully assess R due to acuity of surgery, MMT limited due  to precautions and ROM. Observed to be at least the following:  MMT Right eval Left eval  Shoulder flexion  5  Shoulder extension  5  Shoulder abduction 2 5  Shoulder adduction    Shoulder internal rotation  5  Shoulder external rotation 2 5  Middle trapezius 3 5  Lower trapezius 3 5  Elbow flexion 5 5  Elbow extension 5 5  Grip strength (lbs) 115, 110, 105 120, 105, 95  (Blank rows = not tested)   12/07/21: Palpation; muscular tightness noted through cervical spine musculature and Rt shoulder  girdle _______________________________________________________________________________________________________________________________________   PATIENT EDUCATION: Education details: continuation of HEP Person educated: Patient Education method: Consulting civil engineer, Media planner, and Handouts Education comprehension: verbalized understanding and returned demonstration   HOME EXERCISE PROGRAM: Access Code: [email or web address] URL: [email or web address] Date: 12/14/2021 Prepared by: Gillermo Murdoch  Exercises - Standing Upper Trapezius Stretch  - 2 x daily - 7 x weekly - 1 sets - 10 reps - Supine Shoulder External Rotation with Dowel  - 1 x daily - 7 x weekly - 1 sets - 10 reps - 3 sec hold - Supine Shoulder Abduction AAROM with Dowel  - 1 x daily - 7 x weekly - 1 sets - 10 reps - 3 sec hold - Supine Shoulder Flexion Extension AAROM with Dowel  - 2 x daily - 7 x weekly - 1 sets - 10 reps - Supine Isometric Shoulder Extension with Towel  - 2 x daily - 7 x weekly - 1 sets - 10-12 reps - 3 seconds hold - Supine Isometric Shoulder Adduction with Towel Roll  - 2 x daily - 7 x weekly - 1 sets - 10-12 reps - 3 seconds hold - Prone Scapular Retraction  - 2 x daily - 7 x weekly - 1 sets - 10-12 reps - 3 seconds hold - Standing Isometric Shoulder Flexion with Doorway - Arm Bent  - 2 x daily - 7 x weekly - 1 sets - 10-12 reps - 3 seconds hold - Standing Isometric Shoulder Internal Rotation at Doorway  - 2 x daily - 7 x weekly - 1 sets - 10-12 reps - 3 seconds hold - Standing Isometric Shoulder External Rotation with Doorway  - 2 x daily - 7 x weekly - 1 sets - 10-12 reps - 3 seconds hold - Standing Isometric Shoulder Abduction with Doorway - Arm Bent  - 2 x daily - 7 x weekly - 1 sets - 10-12 reps - 3 seconds hold - Supine Shoulder Rhythmic Stabilization- Horizontal Abduction/Adduction  - 2 x daily - 7 x weekly - 1 sets - 5-10 reps - Standing Shoulder Row Reactive Isometric  - 2 x daily - 7 x weekly - 1 sets  - 10 reps - 30-45 sec  hold - Shoulder Internal Rotation Reactive Isometrics  - 2 x daily - 7 x weekly - 1 sets - 10 reps - 3-5 sec  hold - Shoulder External Rotation Reactive Isometrics  - 1 x daily - 7 x weekly - 1 sets - 5-10 reps - 10-30 sec  hold ASSESSMENT:  CLINICAL IMPRESSION: Tolerated isometric exercises well at neutral. Added TB isometrics red TB row, IR, ER to HEP.  Continued A/AROM to >90 with dowel for forward flexion and abduction.  Patient has greater comfort with towel roll under distal humerus and doing ER in plane of scapula. Continue to progress per MD protocol and to patient/tissue tolerance.  OBJECTIVE IMPAIRMENTS decreased mobility, decreased ROM, decreased strength, hypomobility, increased  fascial restrictions, increased muscle spasms, impaired UE functional use, improper body mechanics, and postural dysfunction.   GOALS: Goals reviewed with patient? Yes  SHORT TERM GOALS: Target date: 01/01/2022   Pt will be ind with initial HEP Baseline: Goal status: IN PROGRESS  2.  Pt will be able to obtain at least 90 deg of shoulder flexion and abd for shoulder elevation Baseline:  Goal status: IN PROGRESS  3.  Pt will be able to obtain at least 40 deg of shoulder ER for improved shoulder ROM Baseline:  Goal status:IN PROGRESS   LONG TERM GOALS: Target date: 02/12/2022    Pt will be ind with maintaining and progressing HEP Baseline:  Goal status:IN PROGRESS  2.  Pt will demo L = R shoulder AROM Baseline:  Goal status: IN PROGRESS  3.  Pt will be able to lift and carry at least 25# for IADLs and work tasks Baseline:  Goal status: IN PROGRESS  4.  Pt will be able to lift at least 5# overhead to reach into cabinets Baseline:  Goal status: IN PROGRESS  5.  Pt will improve FOTO score to >/= 69 Baseline:  Goal status: IN PROGRESS PLAN: PT FREQUENCY: 2x/week  PT DURATION: 12 weeks  PLANNED INTERVENTIONS: Therapeutic exercises, Therapeutic activity,  Neuromuscular re-education, Balance training, Gait training, Patient/Family education, Self Care, Joint mobilization, Dry Needling, Electrical stimulation, Cryotherapy, Moist heat, scar mobilization, Taping, Vasopneumatic device, Ultrasound, Ionotophoresis 4mg /ml Dexamethasone, Manual therapy, and Re-evaluation  PLAN FOR NEXT SESSION: Continue gentle isometrics and scapular strengthening.  Progress A/ROM against gravity and progress to tolerance.   , PT, MPH  12/14/2021, 7:24 AM

## 2021-12-16 ENCOUNTER — Encounter: Payer: Self-pay | Admitting: Rehabilitative and Restorative Service Providers"

## 2021-12-16 ENCOUNTER — Ambulatory Visit: Payer: 59 | Admitting: Rehabilitative and Restorative Service Providers"

## 2021-12-16 DIAGNOSIS — M25511 Pain in right shoulder: Secondary | ICD-10-CM

## 2021-12-16 DIAGNOSIS — M6281 Muscle weakness (generalized): Secondary | ICD-10-CM

## 2021-12-16 DIAGNOSIS — M25611 Stiffness of right shoulder, not elsewhere classified: Secondary | ICD-10-CM

## 2021-12-16 DIAGNOSIS — R29898 Other symptoms and signs involving the musculoskeletal system: Secondary | ICD-10-CM

## 2021-12-16 DIAGNOSIS — G2589 Other specified extrapyramidal and movement disorders: Secondary | ICD-10-CM

## 2021-12-16 NOTE — Therapy (Signed)
OUTPATIENT PHYSICAL THERAPY SHOULDER TREATMENT   Patient Name: Steven Chan MRN: [id number] DOB:1995/04/04, 26 y.o., male Today's Date: 12/16/2021   PT End of Session - 12/16/21 0719     Visit Number 8    Number of Visits 24    Date for PT Re-Evaluation 02/12/22    Authorization Type U[employer]    PT Start Time 0715    PT Stop Time 0800    PT Time Calculation (min) 45 min    Activity Tolerance Patient tolerated treatment well               Past Medical History:  Diagnosis Date   Anxiety    COVID 07/2019   moderate case - 2 weeks with symptoms   Depression    Seasonal allergies    Past Surgical History:  Procedure Laterality Date   MYRINGOTOMY     SHOULDER ARTHROSCOPY WITH LABRAL REPAIR Right 10/20/2021   Procedure: RIGHT SHOULDER POSTERIOR LABRAL TEAR ARTHROSCOPIC;  Surgeon: Cammy Copa, MD;  Location: [facility] OR;  Service: Orthopedics;  Laterality: Right;   WISDOM TOOTH EXTRACTION     Patient Active Problem List   Diagnosis Date Noted   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     PCP: Gildardo Cranker  REFERRING PROVIDER: Cammy Copa, MD   REFERRING DIAG: M25.311 (ICD-10-CM) - Instability of right shoulder joint   THERAPY DIAG:  Acute pain of right shoulder  Stiffness of right shoulder, not elsewhere classified  Muscle weakness (generalized)  Scapular dyskinesis  Other symptoms and signs involving the musculoskeletal system  Rationale for Evaluation and Treatment Rehabilitation  ONSET DATE: 10/20/2021  SUBJECTIVE:                                                                                                                                                                                      SUBJECTIVE STATEMENT: Hagan reports that he woke up with pain in the Rt shoulder this morning. Overall he reports that he is doing well. Exercises are going well.   PERTINENT HISTORY: 10/20/21 right shoulder arthroscopy with  anterior and posterior labral repair; history of multiple shoulder dislocations R&L  PAIN:  Are you having pain? Yes: NPRS scale: 0/10 Pain location: Shoulder, notes some discomfort when driving Pain description: soreness Aggravating factors: driving Relieving factors: positioning  PRECAUTIONS: "Ok for ABD to 90, OK for ER to 40 degrees, no IR, no post capsular stretching, no FF x 2 weeks, then ok for FF to 90 degrees" UPDATE ON 12/09/21:   ff >90, abd >90, er <60, no posterior capsule stretching, ok for RC strengthening and HEP  FALLS:  Has patient fallen  in last 6 months? No  PATIENT GOALS Improve shoulder ROM, return to full activity (rides motorcycle)  OBJECTIVE:   [facility] Adult PT Treatment:                                                DATE:  12/16/21 Therapeutic Exercise: AAROM using pulleys into flexion and scaption 10 sec hold x 10 PT assist to with scapular position  Shoulder flexion rolling swiss ball out x 12 Row; ER; IR isometric step back red TB x 10 each  Rhythmic stabilization supine flex/ext; horiz ab/ad; circles CW/CCW x 15-20 each  Ball btw hands elbow flexion/extension; shoulder extension; supination/pronation; wrist flexion/ext; gently pressing down with ball Rt UE   Manual work:  STM to pt tolerance; PROM Rt shoulder    12/14/21 Therapeutic Exercise: AAROM using pulleys into flexion and scaption 10 sec hold x 10 PT assist to with scapular position  Isometrics supine for extension and adduction 12 reps x 3 second holds Isometrics flexion, abduction, IR/ER in standing 12 rep x 3 second holds Rhythmic stabilization supine flex/ext; horiz ab/ad; circles CW/CCW x 15-20 each  Row; ER; IR isometric step back red TB x 10 each   Manual work:  STM to pt tolerance; PROM Rt shoulder     [facility] Adult PT Treatment:                                                DATE: 12/09/21 Therapeutic Exercise: Warm up with AAROM using pulleys into flexion and scaption Modified HEP  to include AAROM >90 flexion AAROM abduction >90 AAROM ER to 48 degrees Isometrics supine for extension and adduction 12 reps x 3 second holds Isometrics flexion, abduction, IR/ER in standing 12 rep x 3 second holds  Manual Therapy: PROM flexion, abduction, ER, scaption Gentle rocking for grade I distraction  ________________________________________________________________________________________________________________ From eval PATIENT SURVEYS:  FOTO 38; predicted 69 visit 17  UPPER EXTREMITY ROM:   Active ROM Right eval Left eval Right 11/25/21 Right 11/30/21 Right 12/02/21 Right 12/07/21  Shoulder flexion NT 190 AROM Not able  85 AAROM supine 90 AAROM supine  Shoulder extension NT 56 AROM   NT N  Shoulder abduction 63 AROM 70 PROM 180 AROM 74 PROM supine 85 AAROM supine 88 AAROM supine 90 AAROM supine  Shoulder adduction        Shoulder internal rotation NT To T3      Shoulder external rotation 15 AROM * 30 PROM 75 AROM * 32 PROM Supine with arm at neutral 33 PROM Supine arm at neutral with elbow at 90 35 AAROM Supine arm at neutral with elbow at 90 40 AAROM supine in scapular plane elbow 90   Elbow flexion        Elbow extension        (Blank rows = not tested) * = seated with elbow by side  UPPER EXTREMITY MMT: Did not fully assess R due to acuity of surgery, MMT limited due to precautions and ROM. Observed to be at least the following:  MMT Right eval Left eval  Shoulder flexion  5  Shoulder extension  5  Shoulder abduction 2 5  Shoulder adduction    Shoulder internal rotation  5  Shoulder external rotation 2 5  Middle trapezius 3 5  Lower trapezius 3 5  Elbow flexion 5 5  Elbow extension 5 5  Grip strength (lbs) 115, 110, 105 120, 105, 95  (Blank rows = not tested)   12/07/21: Palpation; muscular tightness noted through cervical spine musculature and Rt shoulder  girdle _______________________________________________________________________________________________________________________________________   PATIENT EDUCATION: Education details: continuation of HEP Person educated: Patient Education method: Programmer, multimedia, Demonstration, and Handouts Education comprehension: verbalized understanding and returned demonstration   HOME EXERCISE PROGRAM: Access Code: [email or web address] URL: [email or web address] Date: 12/14/2021 Prepared by: Corlis Leak  Exercises - Standing Upper Trapezius Stretch  - 2 x daily - 7 x weekly - 1 sets - 10 reps - Supine Shoulder External Rotation with Dowel  - 1 x daily - 7 x weekly - 1 sets - 10 reps - 3 sec hold - Supine Shoulder Abduction AAROM with Dowel  - 1 x daily - 7 x weekly - 1 sets - 10 reps - 3 sec hold - Supine Shoulder Flexion Extension AAROM with Dowel  - 2 x daily - 7 x weekly - 1 sets - 10 reps - Supine Isometric Shoulder Extension with Towel  - 2 x daily - 7 x weekly - 1 sets - 10-12 reps - 3 seconds hold - Supine Isometric Shoulder Adduction with Towel Roll  - 2 x daily - 7 x weekly - 1 sets - 10-12 reps - 3 seconds hold - Prone Scapular Retraction  - 2 x daily - 7 x weekly - 1 sets - 10-12 reps - 3 seconds hold - Standing Isometric Shoulder Flexion with Doorway - Arm Bent  - 2 x daily - 7 x weekly - 1 sets - 10-12 reps - 3 seconds hold - Standing Isometric Shoulder Internal Rotation at Doorway  - 2 x daily - 7 x weekly - 1 sets - 10-12 reps - 3 seconds hold - Standing Isometric Shoulder External Rotation with Doorway  - 2 x daily - 7 x weekly - 1 sets - 10-12 reps - 3 seconds hold - Standing Isometric Shoulder Abduction with Doorway - Arm Bent  - 2 x daily - 7 x weekly - 1 sets - 10-12 reps - 3 seconds hold - Supine Shoulder Rhythmic Stabilization- Horizontal Abduction/Adduction  - 2 x daily - 7 x weekly - 1 sets - 5-10 reps - Standing Shoulder Row Reactive Isometric  - 2 x daily - 7 x weekly - 1 sets  - 10 reps - 30-45 sec  hold - Shoulder Internal Rotation Reactive Isometrics  - 2 x daily - 7 x weekly - 1 sets - 10 reps - 3-5 sec  hold - Shoulder External Rotation Reactive Isometrics  - 1 x daily - 7 x weekly - 1 sets - 5-10 reps - 10-30 sec  hold ASSESSMENT:  CLINICAL IMPRESSION: Continued isometric exercises in neutral. Continued A/AROM to >90 with ball on table for forward flexion. Added scapular stabilization using ball btn hands for shoulder movement. Continued with manual work and PROM within specifications of MD orders. Continue to progress per MD protocol and to patient/tissue tolerance.  OBJECTIVE IMPAIRMENTS decreased mobility, decreased ROM, decreased strength, hypomobility, increased fascial restrictions, increased muscle spasms, impaired UE functional use, improper body mechanics, and postural dysfunction.   GOALS: Goals reviewed with patient? Yes  SHORT TERM GOALS: Target date: 01/01/2022   Pt will be ind with initial HEP Baseline: Goal status: IN PROGRESS  2.  Pt will be able  to obtain at least 90 deg of shoulder flexion and abd for shoulder elevation Baseline:  Goal status: IN PROGRESS  3.  Pt will be able to obtain at least 40 deg of shoulder ER for improved shoulder ROM Baseline:  Goal status:IN PROGRESS   LONG TERM GOALS: Target date: 02/12/2022    Pt will be ind with maintaining and progressing HEP Baseline:  Goal status:IN PROGRESS  2.  Pt will demo L = R shoulder AROM Baseline:  Goal status: IN PROGRESS  3.  Pt will be able to lift and carry at least 25# for IADLs and work tasks Baseline:  Goal status: IN PROGRESS  4.  Pt will be able to lift at least 5# overhead to reach into cabinets Baseline:  Goal status: IN PROGRESS  5.  Pt will improve FOTO score to >/= 69 Baseline:  Goal status: IN PROGRESS PLAN: PT FREQUENCY: 2x/week  PT DURATION: 12 weeks  PLANNED INTERVENTIONS: Therapeutic exercises, Therapeutic activity, Neuromuscular  re-education, Balance training, Gait training, Patient/Family education, Self Care, Joint mobilization, Dry Needling, Electrical stimulation, Cryotherapy, Moist heat, scar mobilization, Taping, Vasopneumatic device, Ultrasound, Ionotophoresis 4mg /ml Dexamethasone, Manual therapy, and Re-evaluation  PLAN FOR NEXT SESSION: Continue gentle isometrics and scapular strengthening.  Progress A/ROM against gravity and progress to tolerance.   , PT, MPH  12/16/2021, 7:20 AM

## 2021-12-21 ENCOUNTER — Encounter: Payer: Self-pay | Admitting: Rehabilitative and Restorative Service Providers"

## 2021-12-21 ENCOUNTER — Ambulatory Visit: Payer: 59 | Admitting: Rehabilitative and Restorative Service Providers"

## 2021-12-21 DIAGNOSIS — M25611 Stiffness of right shoulder, not elsewhere classified: Secondary | ICD-10-CM

## 2021-12-21 DIAGNOSIS — G2589 Other specified extrapyramidal and movement disorders: Secondary | ICD-10-CM

## 2021-12-21 DIAGNOSIS — M25511 Pain in right shoulder: Secondary | ICD-10-CM

## 2021-12-21 DIAGNOSIS — R29898 Other symptoms and signs involving the musculoskeletal system: Secondary | ICD-10-CM

## 2021-12-21 DIAGNOSIS — M6281 Muscle weakness (generalized): Secondary | ICD-10-CM

## 2021-12-21 NOTE — Therapy (Signed)
OUTPATIENT PHYSICAL THERAPY SHOULDER TREATMENT   Patient Name: Steven Chan MRN: [id number] DOB:[birth year], 26 y.o., male Today's Date: 12/21/2021   PT End of Session - 12/21/21 0718     Visit Number 9    Number of Visits 24    Date for PT Re-Evaluation 02/12/22    Authorization Type U[employer]    PT Start Time 0716    PT Stop Time 0800    PT Time Calculation (min) 44 min               Past Medical History:  Diagnosis Date   Anxiety    COVID 07/2019   moderate case - 2 weeks with symptoms   Depression    Seasonal allergies    Past Surgical History:  Procedure Laterality Date   MYRINGOTOMY     SHOULDER ARTHROSCOPY WITH LABRAL REPAIR Right 10/20/2021   Procedure: RIGHT SHOULDER POSTERIOR LABRAL TEAR ARTHROSCOPIC;  Surgeon: Cammy Copa, MD;  Location: [facility] OR;  Service: Orthopedics;  Laterality: Right;   WISDOM TOOTH EXTRACTION     Patient Active Problem List   Diagnosis Date Noted   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     PCP: Gildardo Cranker  REFERRING PROVIDER: Cammy Copa, MD   REFERRING DIAG: M25.311 (ICD-10-CM) - Instability of right shoulder joint   THERAPY DIAG:  Acute pain of right shoulder  Stiffness of right shoulder, not elsewhere classified  Muscle weakness (generalized)  Scapular dyskinesis  Other symptoms and signs involving the musculoskeletal system  Rationale for Evaluation and Treatment Rehabilitation  ONSET DATE: 10/20/2021  SUBJECTIVE:                                                                                                                                                                                      SUBJECTIVE STATEMENT: Fountain reports that his shoulder is okay. He notices that his shoulder is not raising up as much when he lifts his arm overhead. Exercises are going well.   PERTINENT HISTORY: 10/20/21 right shoulder arthroscopy with anterior and posterior labral repair; history  of multiple shoulder dislocations R&L  PAIN:  Are you having pain? No: NPRS scale: 0/10 Pain location: Shoulder, notes some discomfort when driving Pain description: soreness Aggravating factors: driving Relieving factors: positioning  PRECAUTIONS: "Ok for ABD to 90, OK for ER to 40 degrees, no IR, no post capsular stretching, no FF x 2 weeks, then ok for FF to 90 degrees" UPDATE ON 12/09/21:   ff >90, abd >90, er <60, no posterior capsule stretching, ok for RC strengthening and HEP  FALLS:  Has patient fallen in last 6 months? No  PATIENT  GOALS Improve shoulder ROM, return to full activity (rides motorcycle)  OBJECTIVE:   [facility] Adult PT Treatment:                                                DATE:  12/21/21 Therapeutic Exercise: AAROM using pulleys into flexion and scaption 10 sec hold x 10 PT  Shoulder flexion rolling swiss ball out x 12 Row; ER; IR isometric step back red TB x 10 each  Rhythmic stabilization supine flex/ext; horiz ab/ad; circles CW/CCW x 15-20 each  Ball btw hands elbow flexion/extension; shoulder extension; supination/pronation; wrist flexion/ext; gently pressing down with ball Rt UE adding hold and varying angles Reverse wall push up x 10 Prolonged snow angel ~ 3 min UE's at ~ 80 degrees  Manual work:  STM to pt tolerance; PROM Rt shoulder TP work through Gaffer and teres/lats   12/16/21 Therapeutic Exercise: AAROM using pulleys into flexion and scaption 10 sec hold x 10 PT assist to with scapular position  Shoulder flexion rolling swiss ball out x 12 Row; ER; IR isometric step back red TB x 10 each  Rhythmic stabilization supine flex/ext; horiz ab/ad; circles CW/CCW x 15-20 each  Ball btw hands elbow flexion/extension; shoulder extension; supination/pronation; wrist flexion/ext; gently pressing down with ball Rt UE   Manual work:  STM to pt tolerance; PROM Rt shoulder    12/14/21 Therapeutic Exercise: AAROM using pulleys into flexion and scaption 10  sec hold x 10 PT assist to with scapular position  Isometrics supine for extension and adduction 12 reps x 3 second holds Isometrics flexion, abduction, IR/ER in standing 12 rep x 3 second holds Rhythmic stabilization supine flex/ext; horiz ab/ad; circles CW/CCW x 15-20 each  Row; ER; IR isometric step back red TB x 10 each   Manual work:  STM to pt tolerance; PROM Rt shoulder    From eval PATIENT SURVEYS:  FOTO 38; predicted 69 visit 17  UPPER EXTREMITY ROM:   Active ROM Right eval Left eval Right 11/25/21 Right 11/30/21 Right 12/02/21 Right 12/07/21  Shoulder flexion NT 190 AROM Not able  85 AAROM supine 90 AAROM supine  Shoulder extension NT 56 AROM   NT N  Shoulder abduction 63 AROM 70 PROM 180 AROM 74 PROM supine 85 AAROM supine 88 AAROM supine 90 AAROM supine  Shoulder adduction        Shoulder internal rotation NT To T3      Shoulder external rotation 15 AROM * 30 PROM 75 AROM * 32 PROM Supine with arm at neutral 33 PROM Supine arm at neutral with elbow at 90 35 AAROM Supine arm at neutral with elbow at 90 40 AAROM supine in scapular plane elbow 90   Elbow flexion        Elbow extension        (Blank rows = not tested) * = seated with elbow by side  UPPER EXTREMITY MMT: Did not fully assess R due to acuity of surgery, MMT limited due to precautions and ROM. Observed to be at least the following:  MMT Right eval Left eval  Shoulder flexion  5  Shoulder extension  5  Shoulder abduction 2 5  Shoulder adduction    Shoulder internal rotation  5  Shoulder external rotation 2 5  Middle trapezius 3 5  Lower trapezius 3 5  Elbow flexion  5 5  Elbow extension 5 5  Grip strength (lbs) 115, 110, 105 120, 105, 95  (Blank rows = not tested)   12/07/21: Palpation; muscular tightness noted through cervical spine musculature and Rt shoulder  girdle _______________________________________________________________________________________________________________________________________   PATIENT EDUCATION: Education details: continuation of HEP Person educated: Patient Education method: Programmer, multimedia, Facilities manager, and Handouts Education comprehension: verbalized understanding and returned demonstration   HOME EXERCISE PROGRAM: Ball between hands - place and hold - can vary positions    Ball between hands at chest level, turn upper body to side (twist) toward both sides      Ball at chest height push up overhead, pause and lower      Ball at side hand resting on ball press into ball, hold, release, repeat  Can vary position of ball to work muscles differently     Backwards shoulder rolls      Hold ball between hands and press together, hold, release, repeat  Can vary position of ball to work muscles differently     Reverse wall push up elbows bent to 90 degrees, press lifting chest forward, hold, release, repeat   [employer] on noodle along spine   Access Code: [email or web address] URL: [email or web address] Date: 12/14/2021 Prepared by: Corlis Leak  Exercises - Standing Upper Trapezius Stretch  - 2 x daily - 7 x weekly - 1 sets - 10 reps - Supine Shoulder External Rotation with Dowel  - 1 x daily - 7 x weekly - 1 sets - 10 reps - 3 sec hold - Supine Shoulder Abduction AAROM with Dowel  - 1 x daily - 7 x weekly - 1 sets - 10 reps - 3 sec hold - Supine Shoulder Flexion Extension AAROM with Dowel  - 2 x daily - 7 x weekly - 1 sets - 10 reps - Supine Isometric Shoulder Extension with Towel  - 2 x daily - 7 x weekly - 1 sets - 10-12 reps - 3 seconds hold - Supine Isometric Shoulder Adduction with Towel Roll  - 2 x daily - 7 x weekly - 1 sets - 10-12 reps - 3 seconds hold - Prone Scapular Retraction  - 2 x daily - 7 x weekly - 1 sets - 10-12 reps - 3 seconds hold - Standing Isometric Shoulder Flexion with  Doorway - Arm Bent  - 2 x daily - 7 x weekly - 1 sets - 10-12 reps - 3 seconds hold - Standing Isometric Shoulder Internal Rotation at Doorway  - 2 x daily - 7 x weekly - 1 sets - 10-12 reps - 3 seconds hold - Standing Isometric Shoulder External Rotation with Doorway  - 2 x daily - 7 x weekly - 1 sets - 10-12 reps - 3 seconds hold - Standing Isometric Shoulder Abduction with Doorway - Arm Bent  - 2 x daily - 7 x weekly - 1 sets - 10-12 reps - 3 seconds hold - Supine Shoulder Rhythmic Stabilization- Horizontal Abduction/Adduction  - 2 x daily - 7 x weekly - 1 sets - 5-10 reps - Standing Shoulder Row Reactive Isometric  - 2 x daily - 7 x weekly - 1 sets - 10 reps - 30-45 sec  hold - Shoulder Internal Rotation Reactive Isometrics  - 2 x daily - 7 x weekly - 1 sets - 10 reps - 3-5 sec  hold - Shoulder External Rotation Reactive Isometrics  - 1 x daily - 7 x weekly - 1 sets - 5-10 reps -  10-30 sec  hold ASSESSMENT:  CLINICAL IMPRESSION: Progressing well with isometric and scapular stabilization exercises in neutral. Continued scapular stabilization using ball btn hands for shoulder movement. Added partial weight bearing in standing.  Continued with manual work and PROM within specifications of MD orders. Continue to progress per MD protocol and to patient/tissue tolerance.  OBJECTIVE IMPAIRMENTS decreased mobility, decreased ROM, decreased strength, hypomobility, increased fascial restrictions, increased muscle spasms, impaired UE functional use, improper body mechanics, and postural dysfunction.   GOALS: Goals reviewed with patient? Yes  SHORT TERM GOALS: Target date: 01/01/2022   Pt will be ind with initial HEP Baseline: Goal status: IN PROGRESS  2.  Pt will be able to obtain at least 90 deg of shoulder flexion and abd for shoulder elevation Baseline:  Goal status: IN PROGRESS  3.  Pt will be able to obtain at least 40 deg of shoulder ER for improved shoulder ROM Baseline:  Goal  status:IN PROGRESS   LONG TERM GOALS: Target date: 02/12/2022    Pt will be ind with maintaining and progressing HEP Baseline:  Goal status:IN PROGRESS  2.  Pt will demo L = R shoulder AROM Baseline:  Goal status: IN PROGRESS  3.  Pt will be able to lift and carry at least 25# for IADLs and work tasks Baseline:  Goal status: IN PROGRESS  4.  Pt will be able to lift at least 5# overhead to reach into cabinets Baseline:  Goal status: IN PROGRESS  5.  Pt will improve FOTO score to >/= 69 Baseline:  Goal status: IN PROGRESS PLAN: PT FREQUENCY: 2x/week  PT DURATION: 12 weeks  PLANNED INTERVENTIONS: Therapeutic exercises, Therapeutic activity, Neuromuscular re-education, Balance training, Gait training, Patient/Family education, Self Care, Joint mobilization, Dry Needling, Electrical stimulation, Cryotherapy, Moist heat, scar mobilization, Taping, Vasopneumatic device, Ultrasound, Ionotophoresis 4mg /ml Dexamethasone, Manual therapy, and Re-evaluation  PLAN FOR NEXT SESSION: Continue gentle isometrics and scapular strengthening.  Progress A/ROM against gravity and progress to tolerance.   Val Rileselyn P Rasha Ibe, PT, MPH  12/21/2021, 8:01 AM

## 2021-12-21 NOTE — Patient Instructions (Signed)
Ball between hands - place and hold - can vary positions   Ball between hands at chest level, turn upper body to side (twist) toward both sides    Ball at chest height push up overhead, pause and lower    Ball at side hand resting on ball press into ball, hold, release, repeat  Can vary position of ball to work muscles differently   Backwards shoulder rolls    Hold ball between hands and press together, hold, release, repeat  Can vary position of ball to work muscles differently   Reverse wall push up elbows bent to 90 degrees, press lifting chest forward, hold, release, repeat

## 2021-12-23 ENCOUNTER — Encounter: Payer: Self-pay | Admitting: Rehabilitative and Restorative Service Providers"

## 2021-12-23 ENCOUNTER — Ambulatory Visit: Payer: 59 | Admitting: Rehabilitative and Restorative Service Providers"

## 2021-12-23 DIAGNOSIS — G2589 Other specified extrapyramidal and movement disorders: Secondary | ICD-10-CM

## 2021-12-23 DIAGNOSIS — M25511 Pain in right shoulder: Secondary | ICD-10-CM

## 2021-12-23 DIAGNOSIS — M6281 Muscle weakness (generalized): Secondary | ICD-10-CM

## 2021-12-23 DIAGNOSIS — R29898 Other symptoms and signs involving the musculoskeletal system: Secondary | ICD-10-CM

## 2021-12-23 DIAGNOSIS — M25611 Stiffness of right shoulder, not elsewhere classified: Secondary | ICD-10-CM

## 2021-12-23 NOTE — Therapy (Signed)
OUTPATIENT PHYSICAL THERAPY SHOULDER TREATMENT   Patient Name: Steven Chan MRN: [id number] DOB:Jan 06, 1996, 26 y.o., male Today's Date: 12/23/2021   PT End of Session - 12/23/21 0718     Visit Number 10    Number of Visits 24    Date for PT Re-Evaluation 02/12/22    Authorization Type U[employer]    PT Start Time 0716    PT Stop Time 0800    PT Time Calculation (min) 44 min    Activity Tolerance Patient tolerated treatment well               Past Medical History:  Diagnosis Date   Anxiety    COVID 07/2019   moderate case - 2 weeks with symptoms   Depression    Seasonal allergies    Past Surgical History:  Procedure Laterality Date   MYRINGOTOMY     SHOULDER ARTHROSCOPY WITH LABRAL REPAIR Right 10/20/2021   Procedure: RIGHT SHOULDER POSTERIOR LABRAL TEAR ARTHROSCOPIC;  Surgeon: Cammy Copa, MD;  Location: [facility] OR;  Service: Orthopedics;  Laterality: Right;   WISDOM TOOTH EXTRACTION     Patient Active Problem List   Diagnosis Date Noted   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     PCP: Gildardo Cranker  REFERRING PROVIDER: Cammy Copa, MD   REFERRING DIAG: M25.311 (ICD-10-CM) - Instability of right shoulder joint   THERAPY DIAG:  Acute pain of right shoulder  Stiffness of right shoulder, not elsewhere classified  Muscle weakness (generalized)  Scapular dyskinesis  Other symptoms and signs involving the musculoskeletal system  Rationale for Evaluation and Treatment Rehabilitation  ONSET DATE: 10/20/2021  SUBJECTIVE:                                                                                                                                                                                      SUBJECTIVE STATEMENT: Steven Chan reports that his shoulder is sore today, no pain.  Exercises are going well. Feels he is getting the shoulder blades down and back more.  PERTINENT HISTORY: 10/21/21 right shoulder arthroscopy with  anterior and posterior labral repair; history of multiple shoulder dislocations R&L  PAIN:  Are you having pain? No: NPRS scale: 0/10 Pain location: Shoulder, notes some discomfort when driving Pain description: soreness Aggravating factors: driving Relieving factors: positioning  PRECAUTIONS: "Ok for ABD to 90, OK for ER to 40 degrees, no IR, no post capsular stretching, no FF x 2 weeks, then ok for FF to 90 degrees" UPDATE ON 12/09/21:   ff >90, abd >90, er <60, no posterior capsule stretching, ok for RC strengthening and HEP  FALLS:  Has patient fallen in  last 6 months? No  PATIENT GOALS Improve shoulder ROM, return to full activity (rides motorcycle)  OBJECTIVE:   [facility] Adult PT Treatment:                                                DATE:  12/23/21 Therapeutic Exercise: AAROM using pulleys into flexion and scaption 10 sec hold x 10 PT assist for scapular positions initially Shoulder flexion partial range 2# DB bilat 10 x 2 sets Row at 30 deg abd isometric step back green TBx10x2 Bodyblade bilat chest level out/in; up/down 1 min ea Supine coregeous ball at T-spine supine T ~ 1-2 min  Rhythmic stabilization supine flex/ext; horiz ab/ad; circles CW/CCW x 15-20 each  Ball btw hands elbow flexion/extension; shoulder extension; supination/pronation; wrist flexion/ext; gently pressing down with ball Rt UE adding hold and varying angles Reverse wall push up x 10  Manual work:  STM to pt tolerance; PROM Rt shoulder TP work through [employer] and Merchant navy officerteres/lats  Trigger Point Dry-Needling  Treatment instructions: Expect mild to moderate muscle soreness. S/S of pneumothorax if dry needled over a lung field, and to seek immediate medical attention should they occur. Patient verbalized understanding of these instructions and education.  Patient Consent Given: Yes Education handout provided: Previously provided Muscles treated: pecs Electrical stimulation performed: Yes Parameters: mAmp current  x 5 min to pt tolerance Treatment response/outcome: decreased palpable tightness, improved tissue extensibility    12/21/21 Therapeutic Exercise: AAROM using pulleys into flexion and scaption 10 sec hold x 10 PT  Shoulder flexion rolling swiss ball out x 12 Row; ER; IR isometric step back red TB x 10 each  Rhythmic stabilization supine flex/ext; horiz ab/ad; circles CW/CCW x 15-20 each  Ball btw hands elbow flexion/extension; shoulder extension; supination/pronation; wrist flexion/ext; gently pressing down with ball Rt UE adding hold and varying angles Reverse wall push up x 10 Prolonged snow angel ~ 3 min UE's at ~ 80 degrees  Manual work:  STM to pt tolerance; PROM Rt shoulder TP work through [employer] and teres/lats   12/16/21 Therapeutic Exercise: AAROM using pulleys into flexion and scaption 10 sec hold x 10 PT assist to with scapular position  Shoulder flexion rolling swiss ball out x 12 Row; ER; IR isometric step back red TB x 10 each  Rhythmic stabilization supine flex/ext; horiz ab/ad; circles CW/CCW x 15-20 each  Ball btw hands elbow flexion/extension; shoulder extension; supination/pronation; wrist flexion/ext; gently pressing down with ball Rt UE   Manual work:  STM to pt tolerance; PROM Rt shoulder    From eval PATIENT SURVEYS:  FOTO 38; predicted 69 visit 17  UPPER EXTREMITY ROM:   Active ROM Right eval Left eval Right 11/25/21 Right 11/30/21 Right 12/02/21 Right 12/07/21  Shoulder flexion NT 190 AROM Not able  85 AAROM supine 90 AAROM supine  Shoulder extension NT 56 AROM   NT N  Shoulder abduction 63 AROM 70 PROM 180 AROM 74 PROM supine 85 AAROM supine 88 AAROM supine 90 AAROM supine  Shoulder adduction        Shoulder internal rotation NT To T3      Shoulder external rotation 15 AROM * 30 PROM 75 AROM * 32 PROM Supine with arm at neutral 33 PROM Supine arm at neutral with elbow at 90 35 AAROM Supine arm at neutral with elbow at 90 40  AAROM supine in  scapular plane elbow 90   Elbow flexion        Elbow extension        (Blank rows = not tested) * = seated with elbow by side PROM Rt shoulder supine - flexion 132 degrees UPPER EXTREMITY MMT: Did not fully assess R due to acuity of surgery, MMT limited due to precautions and ROM. Observed to be at least the following:  MMT Right eval Left eval  Shoulder flexion  5  Shoulder extension  5  Shoulder abduction 2 5  Shoulder adduction    Shoulder internal rotation  5  Shoulder external rotation 2 5  Middle trapezius 3 5  Lower trapezius 3 5  Elbow flexion 5 5  Elbow extension 5 5  Grip strength (lbs) 115, 110, 105 120, 105, 95  (Blank rows = not tested)   12/07/21: Palpation; muscular tightness noted through cervical spine musculature and Rt shoulder girdle _______________________________________________________________________________________________________________________________________   PATIENT EDUCATION: Education details: continuation of HEP Person educated: Patient Education method: Programmer, multimedia, Facilities manager, and Handouts Education comprehension: verbalized understanding and returned demonstration   HOME EXERCISE PROGRAM: Ball between hands - place and hold - can vary positions    Ball between hands at chest level, turn upper body to side (twist) toward both sides      Ball at chest height push up overhead, pause and lower      Ball at side hand resting on ball press into ball, hold, release, repeat  Can vary position of ball to work muscles differently     Backwards shoulder rolls      Hold ball between hands and press together, hold, release, repeat  Can vary position of ball to work muscles differently     Reverse wall push up elbows bent to 90 degrees, press lifting chest forward, hold, release, repeat   [employer] on noodle along spine   Access Code: [email or web address] URL: [email or web address] Date: 12/14/2021 Prepared by: Corlis Leak  Exercises - Standing Upper Trapezius Stretch  - 2 x daily - 7 x weekly - 1 sets - 10 reps - Supine Shoulder External Rotation with Dowel  - 1 x daily - 7 x weekly - 1 sets - 10 reps - 3 sec hold - Supine Shoulder Abduction AAROM with Dowel  - 1 x daily - 7 x weekly - 1 sets - 10 reps - 3 sec hold - Supine Shoulder Flexion Extension AAROM with Dowel  - 2 x daily - 7 x weekly - 1 sets - 10 reps - Supine Isometric Shoulder Extension with Towel  - 2 x daily - 7 x weekly - 1 sets - 10-12 reps - 3 seconds hold - Supine Isometric Shoulder Adduction with Towel Roll  - 2 x daily - 7 x weekly - 1 sets - 10-12 reps - 3 seconds hold - Prone Scapular Retraction  - 2 x daily - 7 x weekly - 1 sets - 10-12 reps - 3 seconds hold - Standing Isometric Shoulder Flexion with Doorway - Arm Bent  - 2 x daily - 7 x weekly - 1 sets - 10-12 reps - 3 seconds hold - Standing Isometric Shoulder Internal Rotation at Doorway  - 2 x daily - 7 x weekly - 1 sets - 10-12 reps - 3 seconds hold - Standing Isometric Shoulder External Rotation with Doorway  - 2 x daily - 7 x weekly - 1 sets - 10-12 reps - 3 seconds hold -  Standing Isometric Shoulder Abduction with Doorway - Arm Bent  - 2 x daily - 7 x weekly - 1 sets - 10-12 reps - 3 seconds hold - Supine Shoulder Rhythmic Stabilization- Horizontal Abduction/Adduction  - 2 x daily - 7 x weekly - 1 sets - 5-10 reps - Standing Shoulder Row Reactive Isometric  - 2 x daily - 7 x weekly - 1 sets - 10 reps - 30-45 sec  hold - Shoulder Internal Rotation Reactive Isometrics  - 2 x daily - 7 x weekly - 1 sets - 10 reps - 3-5 sec  hold - Shoulder External Rotation Reactive Isometrics  - 1 x daily - 7 x weekly - 1 sets - 5-10 reps - 10-30 sec  hold ASSESSMENT:  CLINICAL IMPRESSION: Progressing well with isometric and scapular stabilization exercises in neutral. Continued scapular stabilization using bodyblade btn hands for shoulder girdle strengthening. Continued partial weight bearing  exercises in standing.  Continued with manual work and PROM within specifications of MD orders. Trial of DN to areas of tightness. Tolerated well. Continue to progress per MD protocol and to patient/tissue tolerance.  OBJECTIVE IMPAIRMENTS decreased mobility, decreased ROM, decreased strength, hypomobility, increased fascial restrictions, increased muscle spasms, impaired UE functional use, improper body mechanics, and postural dysfunction.   GOALS: Goals reviewed with patient? Yes  SHORT TERM GOALS: Target date: 01/01/2022   Pt will be ind with initial HEP Baseline: Goal status: IN PROGRESS  2.  Pt will be able to obtain at least 90 deg of shoulder flexion and abd for shoulder elevation Baseline:  Goal status: IN PROGRESS  3.  Pt will be able to obtain at least 40 deg of shoulder ER for improved shoulder ROM Baseline:  Goal status:IN PROGRESS   LONG TERM GOALS: Target date: 02/12/2022    Pt will be ind with maintaining and progressing HEP Baseline:  Goal status:IN PROGRESS  2.  Pt will demo L = R shoulder AROM Baseline:  Goal status: IN PROGRESS  3.  Pt will be able to lift and carry at least 25# for IADLs and work tasks Baseline:  Goal status: IN PROGRESS  4.  Pt will be able to lift at least 5# overhead to reach into cabinets Baseline:  Goal status: IN PROGRESS  5.  Pt will improve FOTO score to >/= 69 Baseline:  Goal status: IN PROGRESS PLAN: PT FREQUENCY: 2x/week  PT DURATION: 12 weeks  PLANNED INTERVENTIONS: Therapeutic exercises, Therapeutic activity, Neuromuscular re-education, Balance training, Gait training, Patient/Family education, Self Care, Joint mobilization, Dry Needling, Electrical stimulation, Cryotherapy, Moist heat, scar mobilization, Taping, Vasopneumatic device, Ultrasound, Ionotophoresis 4mg /ml Dexamethasone, Manual therapy, and Re-evaluation  PLAN FOR NEXT SESSION: Continue gentle isometrics and scapular strengthening.  Progress A/ROM against  gravity and progress to tolerance.   , PT, MPH  12/23/2021, 8:04 AM

## 2021-12-28 ENCOUNTER — Ambulatory Visit: Payer: 59 | Admitting: Rehabilitative and Restorative Service Providers"

## 2021-12-30 ENCOUNTER — Ambulatory Visit: Payer: 59 | Admitting: Rehabilitative and Restorative Service Providers"

## 2022-01-06 ENCOUNTER — Ambulatory Visit: Payer: 59 | Admitting: Physical Therapy

## 2022-01-06 ENCOUNTER — Encounter: Payer: Self-pay | Admitting: Physical Therapy

## 2022-01-06 DIAGNOSIS — M25611 Stiffness of right shoulder, not elsewhere classified: Secondary | ICD-10-CM

## 2022-01-06 DIAGNOSIS — M25511 Pain in right shoulder: Secondary | ICD-10-CM | POA: Diagnosis not present

## 2022-01-06 DIAGNOSIS — G2589 Other specified extrapyramidal and movement disorders: Secondary | ICD-10-CM

## 2022-01-06 DIAGNOSIS — M6281 Muscle weakness (generalized): Secondary | ICD-10-CM

## 2022-01-06 NOTE — Therapy (Signed)
OUTPATIENT PHYSICAL THERAPY SHOULDER TREATMENT   Patient Name: Steven Chan MRN: [id number] DOB:25-Dec-1995, 26 y.o., male Today's Date: 01/06/2022   PT End of Session - 01/06/22 0757     Visit Number 11    Number of Visits 24    Date for PT Re-Evaluation 02/12/22    Authorization Type UHC    PT Start Time 0800    PT Stop Time 0845    PT Time Calculation (min) 45 min    Activity Tolerance Patient tolerated treatment well    Behavior During Therapy [facility] for tasks assessed/performed               Past Medical History:  Diagnosis Date   Anxiety    COVID 07/2019   moderate case - 2 weeks with symptoms   Depression    Seasonal allergies    Past Surgical History:  Procedure Laterality Date   MYRINGOTOMY     SHOULDER ARTHROSCOPY WITH LABRAL REPAIR Right 10/20/2021   Procedure: RIGHT SHOULDER POSTERIOR LABRAL TEAR ARTHROSCOPIC;  Surgeon: Cammy Copa, MD;  Location: 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     PCP: Gildardo Cranker  REFERRING PROVIDER: Cammy Copa, MD   REFERRING DIAG: M25.311 (ICD-10-CM) - Instability of right shoulder joint   THERAPY DIAG:  Acute pain of right shoulder  Stiffness of right shoulder, not elsewhere classified  Muscle weakness (generalized)  Scapular dyskinesis  Rationale for Evaluation and Treatment Rehabilitation  ONSET DATE: 10/20/2021  SUBJECTIVE:                                                                                                                                                                                      SUBJECTIVE STATEMENT: Pt states that his shoulder stiffness has not been as bad. Pt will be following up with ortho on 12/8.   PERTINENT HISTORY: 10/21/21 right shoulder arthroscopy with anterior and posterior labral repair;  history of multiple shoulder dislocations R&L  PAIN:  Are you having pain? No: NPRS scale: 0/10 Pain location: Shoulder, notes some discomfort when driving Pain description: soreness Aggravating factors: driving Relieving factors: positioning  PRECAUTIONS: "Ok for ABD to 90, OK for ER to 40 degrees, no IR, no post capsular stretching, no FF x 2 weeks, then ok for FF to 90 degrees" UPDATE ON 12/09/21:   ff >90, abd >90, er <60, no posterior capsule stretching, ok for RC strengthening and HEP  FALLS:  Has patient fallen in last 6 months? No  PATIENT GOALS Improve shoulder ROM, return to full activity (rides motorcycle)  OBJECTIVE:  [facility] Adult PT Treatment:                                                DATE: 01/06/22 Therapeutic Exercise: AAROM pulleys flexion and scaption 10x10 sec Supine AAROM with dowel into FF, scaption, ER to 60 deg x10 AROM FF, scaption, ER x5 FF, scaption, ER (at 0 and then at 30 deg abd) x 10 each yellow TB Standing Bodyblade bilat chest level out/in; up/down 1 min ea Reverse wall push up x10 Wall push up plus 2x10 Wall clock yellow TB x10 each direction Manual Therapy: STM to pt tolerance; PROM Rt shoulder TP work through [employer] and teres/lats     [facility] Adult PT Treatment:                                                DATE: 12/23/21 Therapeutic Exercise: AAROM using pulleys into flexion and scaption 10 sec hold x 10 PT assist for scapular positions initially Shoulder flexion partial range 2# DB bilat 10 x 2 sets Row at 30 deg abd isometric step back green TBx10x2 Bodyblade bilat chest level out/in; up/down 1 min ea Supine coregeous ball at T-spine supine T ~ 1-2 min  Rhythmic stabilization supine flex/ext; horiz ab/ad; circles CW/CCW x 15-20 each  Ball btw hands elbow flexion/extension; shoulder extension; supination/pronation; wrist flexion/ext; gently pressing down with ball Rt UE adding hold and varying angles Reverse wall push up x 10  Manual  work:  STM to pt tolerance; PROM Rt shoulder TP work through [employer] and Merchant navy officerteres/lats  Trigger Point Dry-Needling  Treatment instructions: Expect mild to moderate muscle soreness. S/S of pneumothorax if dry needled over a lung field, and to seek immediate medical attention should they occur. Patient verbalized understanding of these instructions and education.  Patient Consent Given: Yes Education handout provided: Previously provided Muscles treated: pecs Electrical stimulation performed: Yes Parameters: mAmp current x 5 min to pt tolerance Treatment response/outcome: decreased palpable tightness, improved tissue extensibility    12/21/21 Therapeutic Exercise: AAROM using pulleys into flexion and scaption 10 sec hold x 10 PT  Shoulder flexion rolling swiss ball out x 12 Row; ER; IR isometric step back red TB x 10 each  Rhythmic stabilization supine flex/ext; horiz ab/ad; circles CW/CCW x 15-20 each  Ball btw hands elbow flexion/extension; shoulder extension; supination/pronation; wrist flexion/ext; gently pressing down with ball Rt UE adding hold and varying angles Reverse wall push up x 10 Prolonged snow angel ~ 3 min UE's at ~ 80 degrees  Manual work:  STM to pt tolerance; PROM Rt shoulder TP work through [employer] and teres/lats     From eval PATIENT SURVEYS:  FOTO 38; predicted 69 visit 17  UPPER EXTREMITY ROM:   Active ROM Right eval Left eval Right 11/30/21 Right 12/02/21 Right 12/07/21 Right 01/06/22  Shoulder flexion NT 190 AROM  85 AAROM supine 90 AAROM supine 140 AROM 140 PROM supine  Shoulder extension NT 56 AROM  NT N   Shoulder abduction 63 AROM 70 PROM 180 AROM 85 AAROM supine 88 AAROM supine 90 AAROM supine 122 AAROM supine  Shoulder adduction  Shoulder internal rotation NT To T3      Shoulder external rotation 15 AROM * 30 PROM 75 AROM * 33 PROM Supine arm at neutral with elbow at 90 35 AAROM Supine arm at neutral with elbow at 90 40 AAROM supine in  scapular plane elbow 90  60 AROM supine  Elbow flexion        Elbow extension        (Blank rows = not tested) * = seated with elbow by side PROM Rt shoulder supine - flexion 132 degrees UPPER EXTREMITY MMT: Did not fully assess R due to acuity of surgery, MMT limited due to precautions and ROM. Observed to be at least the following:  MMT Right eval Left eval  Shoulder flexion  5  Shoulder extension  5  Shoulder abduction 2 5  Shoulder adduction    Shoulder internal rotation  5  Shoulder external rotation 2 5  Middle trapezius 3 5  Lower trapezius 3 5  Elbow flexion 5 5  Elbow extension 5 5  Grip strength (lbs) 115, 110, 105 120, 105, 95  (Blank rows = not tested)   12/07/21: Palpation; muscular tightness noted through cervical spine musculature and Rt shoulder girdle _______________________________________________________________________________________________________________________________________   PATIENT EDUCATION: Education details: continuation of HEP Person educated: Patient Education method: Programmer, multimedia, Facilities manager, and Handouts Education comprehension: verbalized understanding and returned demonstration   HOME EXERCISE PROGRAM: Ball between hands - place and hold - can vary positions    Ball between hands at chest level, turn upper body to side (twist) toward both sides      Ball at chest height push up overhead, pause and lower      Ball at side hand resting on ball press into ball, hold, release, repeat  Can vary position of ball to work muscles differently     Backwards shoulder rolls      Hold ball between hands and press together, hold, release, repeat  Can vary position of ball to work muscles differently     Reverse wall push up elbows bent to 90 degrees, press lifting chest forward, hold, release, repeat   [employer] on noodle along spine   Access Code: [email or web address] URL: [email or web address] Date:  12/14/2021 Prepared by: Corlis Leak  Exercises - Standing Upper Trapezius Stretch  - 2 x daily - 7 x weekly - 1 sets - 10 reps - Supine Shoulder External Rotation with Dowel  - 1 x daily - 7 x weekly - 1 sets - 10 reps - 3 sec hold - Supine Shoulder Abduction AAROM with Dowel  - 1 x daily - 7 x weekly - 1 sets - 10 reps - 3 sec hold - Supine Shoulder Flexion Extension AAROM with Dowel  - 2 x daily - 7 x weekly - 1 sets - 10 reps - Supine Isometric Shoulder Extension with Towel  - 2 x daily - 7 x weekly - 1 sets - 10-12 reps - 3 seconds hold - Supine Isometric Shoulder Adduction with Towel Roll  - 2 x daily - 7 x weekly - 1 sets - 10-12 reps - 3 seconds hold - Prone Scapular Retraction  - 2 x daily - 7 x weekly - 1 sets - 10-12 reps - 3 seconds hold - Standing Isometric Shoulder Flexion with Doorway - Arm Bent  - 2 x daily - 7 x weekly - 1 sets - 10-12 reps - 3 seconds hold - Standing Isometric Shoulder Internal Rotation at Doorway  -  2 x daily - 7 x weekly - 1 sets - 10-12 reps - 3 seconds hold - Standing Isometric Shoulder External Rotation with Doorway  - 2 x daily - 7 x weekly - 1 sets - 10-12 reps - 3 seconds hold - Standing Isometric Shoulder Abduction with Doorway - Arm Bent  - 2 x daily - 7 x weekly - 1 sets - 10-12 reps - 3 seconds hold - Supine Shoulder Rhythmic Stabilization- Horizontal Abduction/Adduction  - 2 x daily - 7 x weekly - 1 sets - 5-10 reps - Standing Shoulder Row Reactive Isometric  - 2 x daily - 7 x weekly - 1 sets - 10 reps - 30-45 sec  hold - Shoulder Internal Rotation Reactive Isometrics  - 2 x daily - 7 x weekly - 1 sets - 10 reps - 3-5 sec  hold - Shoulder External Rotation Reactive Isometrics  - 1 x daily - 7 x weekly - 1 sets - 5-10 reps - 10-30 sec  hold ASSESSMENT:  CLINICAL IMPRESSION: Pt continues to progress well with his ROM. Greatest difficulty is with abduction. Initiated gentle resistance band strengthening for RTC.   OBJECTIVE IMPAIRMENTS decreased  mobility, decreased ROM, decreased strength, hypomobility, increased fascial restrictions, increased muscle spasms, impaired UE functional use, improper body mechanics, and postural dysfunction.   GOALS: Goals reviewed with patient? Yes  SHORT TERM GOALS: Target date: 01/01/2022   Pt will be ind with initial HEP Baseline: Goal status: IN PROGRESS  2.  Pt will be able to obtain at least 90 deg of shoulder flexion and abd for shoulder elevation Baseline:  Goal status: IN PROGRESS  3.  Pt will be able to obtain at least 40 deg of shoulder ER for improved shoulder ROM Baseline:  Goal status:IN PROGRESS   LONG TERM GOALS: Target date: 02/12/2022    Pt will be ind with maintaining and progressing HEP Baseline:  Goal status:IN PROGRESS  2.  Pt will demo L = R shoulder AROM Baseline:  Goal status: IN PROGRESS  3.  Pt will be able to lift and carry at least 25# for IADLs and work tasks Baseline:  Goal status: IN PROGRESS  4.  Pt will be able to lift at least 5# overhead to reach into cabinets Baseline:  Goal status: IN PROGRESS  5.  Pt will improve FOTO score to >/= 69 Baseline:  Goal status: IN PROGRESS PLAN: PT FREQUENCY: 2x/week  PT DURATION: 12 weeks  PLANNED INTERVENTIONS: Therapeutic exercises, Therapeutic activity, Neuromuscular re-education, Balance training, Gait training, Patient/Family education, Self Care, Joint mobilization, Dry Needling, Electrical stimulation, Cryotherapy, Moist heat, scar mobilization, Taping, Vasopneumatic device, Ultrasound, Ionotophoresis 4mg /ml Dexamethasone, Manual therapy, and Re-evaluation  PLAN FOR NEXT SESSION: Continue gentle isometrics and scapular strengthening.  Progress A/ROM against gravity and progress to tolerance.   Marlie Kuennen April Ma L Shonica Weier, PT, DPT  01/06/2022, 7:58 AM

## 2022-01-08 ENCOUNTER — Ambulatory Visit: Payer: 59 | Admitting: Physical Therapy

## 2022-01-11 ENCOUNTER — Ambulatory Visit: Payer: 59 | Attending: Orthopedic Surgery | Admitting: Physical Therapy

## 2022-01-11 ENCOUNTER — Encounter: Payer: Self-pay | Admitting: Physical Therapy

## 2022-01-11 DIAGNOSIS — G2589 Other specified extrapyramidal and movement disorders: Secondary | ICD-10-CM | POA: Insufficient documentation

## 2022-01-11 DIAGNOSIS — M6281 Muscle weakness (generalized): Secondary | ICD-10-CM | POA: Diagnosis present

## 2022-01-11 DIAGNOSIS — M25611 Stiffness of right shoulder, not elsewhere classified: Secondary | ICD-10-CM | POA: Diagnosis present

## 2022-01-11 DIAGNOSIS — M25511 Pain in right shoulder: Secondary | ICD-10-CM | POA: Insufficient documentation

## 2022-01-11 DIAGNOSIS — R29898 Other symptoms and signs involving the musculoskeletal system: Secondary | ICD-10-CM | POA: Diagnosis present

## 2022-01-11 NOTE — Therapy (Signed)
OUTPATIENT PHYSICAL THERAPY SHOULDER TREATMENT   Patient Name: LEVELLE EDELEN MRN: [id number] DOB:28-Sep-1995, 26 y.o., male Today's Date: 01/11/2022   PT End of Session - 01/11/22 0717     Visit Number 12    Number of Visits 24    Date for PT Re-Evaluation 02/12/22    Authorization Type UHC    PT Start Time 0717    PT Stop Time 0800    PT Time Calculation (min) 43 min    Activity Tolerance Patient tolerated treatment well    Behavior During Therapy [facility] for tasks assessed/performed               Past Medical History:  Diagnosis Date   Anxiety    COVID 07/2019   moderate case - 2 weeks with symptoms   Depression    Seasonal allergies    Past Surgical History:  Procedure Laterality Date   MYRINGOTOMY     SHOULDER ARTHROSCOPY WITH LABRAL REPAIR Right 10/20/2021   Procedure: RIGHT SHOULDER POSTERIOR LABRAL TEAR ARTHROSCOPIC;  Surgeon: Meredith Pel, MD;  Location: [place];  Service: Orthopedics;  Laterality: Right;   WISDOM TOOTH EXTRACTION     Patient Active Problem List   Diagnosis Date Noted   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     PCP: Lona Kettle  REFERRING PROVIDER: Meredith Pel, MD   REFERRING DIAG: M25.311 (ICD-10-CM) - Instability of right shoulder joint   THERAPY DIAG:  Acute pain of right shoulder  Stiffness of right shoulder, not elsewhere classified  Muscle weakness (generalized)  Scapular dyskinesis  Other symptoms and signs involving the musculoskeletal system  Rationale for Evaluation and Treatment Rehabilitation  ONSET DATE: 10/20/2021  SUBJECTIVE:                                                                                                                                                                                      SUBJECTIVE STATEMENT: Shoulder doing well. Nothing new or different to report.   PERTINENT HISTORY: 10/21/21 right shoulder arthroscopy with anterior and posterior  labral repair; history of multiple shoulder dislocations R&L  PAIN:  Are you having pain? No: NPRS scale: 0/10 Pain location: Shoulder, notes some discomfort when driving Pain description: soreness Aggravating factors: driving Relieving factors: positioning  PRECAUTIONS: "Ok for ABD to 90, OK for ER to 40 degrees, no IR, no post capsular stretching, no FF x 2 weeks, then ok for FF to 90 degrees" UPDATE ON 12/09/21:   ff >90, abd >90, er <60, no posterior capsule stretching, ok for RC strengthening and HEP  FALLS:  Has patient fallen in last 6 months? No  PATIENT  GOALS Improve shoulder ROM, return to full activity (rides motorcycle)  OBJECTIVE:  [place] Adult PT Treatment:                                                DATE: 01/11/22 Therapeutic Exercise: AAROM pulleys flexion and scaption x2 min each Supine AROM FF, scaption, ER x10 each Sitting FF, scaption, ER, W 2x10 each yellow TB Standing Low and mid row green TB 2x10 Shoulder ext green TB 2x10 Finger ladder scaption x 5 Manual Therapy: STM to pt tolerance; PROM Rt shoulder TP work through Equities trader Adult PT Treatment:                                                DATE: 01/06/22 Therapeutic Exercise: AAROM pulleys flexion and scaption 10x10 sec Supine AAROM with dowel into FF, scaption, ER to 60 deg x10 AROM FF, scaption, ER x5 FF, scaption, ER (at 0 and then at 30 deg abd) x 10 each yellow TB Standing Bodyblade bilat chest level out/in; up/down 1 min ea Reverse wall push up x10 Wall push up plus 2x10 Wall clock yellow TB x10 each direction Manual Therapy: STM to pt tolerance; PROM Rt shoulder TP work through Arts development officer and teres/lats     From eval unless otherwise noted PATIENT SURVEYS:  FOTO 38; predicted 69 visit 17  UPPER EXTREMITY ROM:   Active ROM Right eval Left eval Right 12/02/21 Right 12/07/21 Right 01/06/22  Shoulder flexion NT 190 AROM 85 AAROM supine 90 AAROM supine 140 AROM 140  PROM supine  Shoulder extension NT 56 AROM NT N   Shoulder abduction 63 AROM 70 PROM 180 AROM 88 AAROM supine 90 AAROM supine 122 AAROM supine  Shoulder adduction       Shoulder internal rotation NT To T3     Shoulder external rotation 15 AROM * 30 PROM 75 AROM * 35 AAROM Supine arm at neutral with elbow at 90 40 AAROM supine in scapular plane elbow 90  60 AROM supine  Elbow flexion       Elbow extension       (Blank rows = not tested) * = seated with elbow by side PROM Rt shoulder supine - flexion 132 degrees UPPER EXTREMITY MMT: Did not fully assess R due to acuity of surgery, MMT limited due to precautions and ROM. Observed to be at least the following:  MMT Right eval Left eval  Shoulder flexion  5  Shoulder extension  5  Shoulder abduction 2 5  Shoulder adduction    Shoulder internal rotation  5  Shoulder external rotation 2 5  Middle trapezius 3 5  Lower trapezius 3 5  Elbow flexion 5 5  Elbow extension 5 5  Grip strength (lbs) 115, 110, 105 120, 105, 95  (Blank rows = not tested)   12/07/21: Palpation; muscular tightness noted through cervical spine musculature and Rt shoulder girdle ________________________________________________________________________________________________________________________________  PATIENT EDUCATION: Education details: continuation of HEP Person educated: Patient Education method: Consulting civil engineer, Media planner, and Handouts Education comprehension: verbalized understanding and returned demonstration   HOME EXERCISE PROGRAM: Ball between hands - place and hold - can vary positions    [place] between hands at chest  level, turn upper body to side (twist) toward both sides      Ball at chest height push up overhead, pause and lower      Ball at side hand resting on ball press into ball, hold, release, repeat  Can vary position of ball to work muscles differently     Backwards shoulder rolls      Hold ball between hands and press  together, hold, release, repeat  Can vary position of ball to work muscles differently     Reverse wall push up elbows bent to 90 degrees, press lifting chest forward, hold, release, repeat   [employer] on noodle along spine   Access Code: [email or web address] URL: [email or web address] Date: 12/14/2021 Prepared by: Gillermo Murdoch  Exercises - Standing Upper Trapezius Stretch  - 2 x daily - 7 x weekly - 1 sets - 10 reps - Supine Shoulder External Rotation with Dowel  - 1 x daily - 7 x weekly - 1 sets - 10 reps - 3 sec hold - Supine Shoulder Abduction AAROM with Dowel  - 1 x daily - 7 x weekly - 1 sets - 10 reps - 3 sec hold - Supine Shoulder Flexion Extension AAROM with Dowel  - 2 x daily - 7 x weekly - 1 sets - 10 reps - Supine Isometric Shoulder Extension with Towel  - 2 x daily - 7 x weekly - 1 sets - 10-12 reps - 3 seconds hold - Supine Isometric Shoulder Adduction with Towel Roll  - 2 x daily - 7 x weekly - 1 sets - 10-12 reps - 3 seconds hold - Prone Scapular Retraction  - 2 x daily - 7 x weekly - 1 sets - 10-12 reps - 3 seconds hold - Standing Isometric Shoulder Flexion with Doorway - Arm Bent  - 2 x daily - 7 x weekly - 1 sets - 10-12 reps - 3 seconds hold - Standing Isometric Shoulder Internal Rotation at Doorway  - 2 x daily - 7 x weekly - 1 sets - 10-12 reps - 3 seconds hold - Standing Isometric Shoulder External Rotation with Doorway  - 2 x daily - 7 x weekly - 1 sets - 10-12 reps - 3 seconds hold - Standing Isometric Shoulder Abduction with Doorway - Arm Bent  - 2 x daily - 7 x weekly - 1 sets - 10-12 reps - 3 seconds hold - Supine Shoulder Rhythmic Stabilization- Horizontal Abduction/Adduction  - 2 x daily - 7 x weekly - 1 sets - 5-10 reps - Standing Shoulder Row Reactive Isometric  - 2 x daily - 7 x weekly - 1 sets - 10 reps - 30-45 sec  hold - Shoulder Internal Rotation Reactive Isometrics  - 2 x daily - 7 x weekly - 1 sets - 10 reps - 3-5 sec  hold - Shoulder  External Rotation Reactive Isometrics  - 1 x daily - 7 x weekly - 1 sets - 5-10 reps - 10-30 sec  hold ASSESSMENT:  CLINICAL IMPRESSION: Continued to work on improving ROM and rotator cuff strengthening.   OBJECTIVE IMPAIRMENTS decreased mobility, decreased ROM, decreased strength, hypomobility, increased fascial restrictions, increased muscle spasms, impaired UE functional use, improper body mechanics, and postural dysfunction.   GOALS: Goals reviewed with patient? Yes  SHORT TERM GOALS: Target date: 01/01/2022   Pt will be ind with initial HEP Baseline: Goal status: MET  2.  Pt will be able to obtain at least 90 deg of shoulder  flexion and abd for shoulder elevation Baseline:  Goal status: MET  3.  Pt will be able to obtain at least 40 deg of shoulder ER for improved shoulder ROM Baseline:  Goal status: MET   LONG TERM GOALS: Target date: 02/12/2022    Pt will be ind with maintaining and progressing HEP Baseline:  Goal status:IN PROGRESS  2.  Pt will demo L = R shoulder AROM Baseline:  Goal status: IN PROGRESS  3.  Pt will be able to lift and carry at least 25# for IADLs and work tasks Baseline:  Goal status: IN PROGRESS  4.  Pt will be able to lift at least 5# overhead to reach into cabinets Baseline:  Goal status: IN PROGRESS  5.  Pt will improve FOTO score to >/= 69 Baseline:  Goal status: IN PROGRESS PLAN: PT FREQUENCY: 2x/week  PT DURATION: 12 weeks  PLANNED INTERVENTIONS: Therapeutic exercises, Therapeutic activity, Neuromuscular re-education, Balance training, Gait training, Patient/Family education, Self Care, Joint mobilization, Dry Needling, Electrical stimulation, Cryotherapy, Moist heat, scar mobilization, Taping, Vasopneumatic device, Ultrasound, Ionotophoresis 70m/ml Dexamethasone, Manual therapy, and Re-evaluation  PLAN FOR NEXT SESSION: Continue RC strengthening and scapular strengthening.  Progress A/ROM against gravity and progress to  tolerance.   Lynnelle Mesmer April Ma L Kotaro Buer, PT, DPT  01/11/2022, 7:17 AM

## 2022-01-13 ENCOUNTER — Ambulatory Visit: Payer: 59 | Admitting: Physical Therapy

## 2022-01-13 ENCOUNTER — Encounter: Payer: Self-pay | Admitting: Physical Therapy

## 2022-01-13 DIAGNOSIS — M6281 Muscle weakness (generalized): Secondary | ICD-10-CM

## 2022-01-13 DIAGNOSIS — R29898 Other symptoms and signs involving the musculoskeletal system: Secondary | ICD-10-CM

## 2022-01-13 DIAGNOSIS — M25511 Pain in right shoulder: Secondary | ICD-10-CM

## 2022-01-13 DIAGNOSIS — M25611 Stiffness of right shoulder, not elsewhere classified: Secondary | ICD-10-CM

## 2022-01-13 DIAGNOSIS — G2589 Other specified extrapyramidal and movement disorders: Secondary | ICD-10-CM

## 2022-01-13 NOTE — Therapy (Addendum)
OUTPATIENT PHYSICAL THERAPY SHOULDER TREATMENT AND DISCHARGE   Patient Name: Steven Chan MRN: [id number] DOB:03-24-1995, 26 y.o., male Today's Date: 01/13/2022   PT End of Session - 01/13/22 0717     Visit Number 13    Number of Visits 24    Date for PT Re-Evaluation 02/12/22    Authorization Type UHC    PT Start Time 0717    PT Stop Time 0800    PT Time Calculation (min) 43 min    Activity Tolerance Patient tolerated treatment well    Behavior During Therapy [facility] for tasks assessed/performed             PHYSICAL THERAPY DISCHARGE SUMMARY  Visits from Start of Care: 13  Current functional level related to goals / functional outcomes: Improving ROM -- getting cleared for more strengthening by ortho   Remaining deficits: ROM and strength still not to within goal range   Education / Equipment: See below   Patient agrees to discharge. Patient goals were not met. Patient is being discharged due to not returning since the last visit.   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     PCP: Lona Kettle  REFERRING PROVIDER: Meredith Pel, MD   REFERRING DIAG: M25.311 (ICD-10-CM) - Instability of right shoulder joint   THERAPY DIAG:  Acute pain of right shoulder  Stiffness of right shoulder, not elsewhere classified  Muscle weakness (generalized)  Scapular dyskinesis  Other symptoms and signs involving the  musculoskeletal system  Rationale for Evaluation and Treatment Rehabilitation  ONSET DATE: 10/20/2021  SUBJECTIVE:                                                                                                                                                                                      SUBJECTIVE STATEMENT: Pt reports nothing new or different. Has been using the yellow bands at home for strengthening.   PERTINENT HISTORY: 10/21/21 right shoulder arthroscopy with anterior and posterior labral repair; history of multiple shoulder dislocations R&L  PAIN:  Are you having pain? No: NPRS scale:  0/10 Pain location: Shoulder, notes some discomfort when driving Pain description: soreness Aggravating factors: driving Relieving factors: positioning  PRECAUTIONS: "Ok for ABD to 90, OK for ER to 40 degrees, no IR, no post capsular stretching, no FF x 2 weeks, then ok for FF to 90 degrees" UPDATE ON 12/09/21:   ff >90, abd >90, er <60, no posterior capsule stretching, ok for RC strengthening and HEP  FALLS:  Has patient fallen in last 6 months? No  PATIENT GOALS Improve shoulder ROM, return to full activity (rides motorcycle)  OBJECTIVE:  [place] Adult PT Treatment:                                                DATE: 01/13/22 Therapeutic Exercise: AAROM pulleys flexion and scaption x2 min each Supine AROM FF, scaption, ER x10 each Standing Low and then mid row at pulleys 20# 2x10 each Lawnmower at pulleys 10# 2x10 Shoulder ER reactive iso 5# x10 Shoulder IR reactive iso 5# x10 Shoulder flex yellow TB x10 Shoulder scaption yellow TB x10 Manual Therapy: STM to pt tolerance; PROM Rt shoulder TP work through Equities trader Adult PT Treatment:                                                DATE: 01/11/22 Therapeutic Exercise: AAROM pulleys flexion and scaption x2 min each Supine AROM FF, scaption, ER x10 each Sitting FF, scaption, ER, W 2x10 each yellow  TB Standing Low and mid row green TB 2x10 Shoulder ext green TB 2x10 Finger ladder scaption x 5 Manual Therapy: STM to pt tolerance; PROM Rt shoulder TP work through Arts development officer and teres/lats     From eval unless otherwise noted PATIENT SURVEYS:  FOTO 38; predicted 69 visit 17  UPPER EXTREMITY ROM:   Active ROM Right eval Left eval Right 12/02/21 Right 12/07/21 Right 01/06/22 Right 01/13/22  Shoulder flexion NT 190 AROM 85 AAROM supine 90 AAROM supine 140 AROM 140 PROM supine 153 AROM Supine  Shoulder extension NT 56 AROM NT N    Shoulder abduction 63 AROM 70 PROM 180 AROM 88 AAROM supine 90 AAROM supine 122 AAROM supine 140 supine AROM (scaption)  Shoulder adduction        Shoulder internal rotation NT To T3      Shoulder external rotation 15 AROM * 30 PROM 75 AROM * 35 AAROM Supine arm at neutral with elbow at 90 40 AAROM supine in scapular plane elbow 90  60 AROM supine 60 AROM supine  Elbow flexion        Elbow extension        (Blank rows = not tested) * = seated with elbow by side PROM Rt shoulder supine - flexion 132 degrees UPPER EXTREMITY MMT: Did not fully assess R due to acuity of surgery, MMT limited due to precautions and ROM. Observed to be at least the following:  MMT Right eval Left eval  Shoulder flexion  5  Shoulder extension  5  Shoulder abduction 2 5  Shoulder adduction    Shoulder internal rotation  5  Shoulder external rotation 2 5  Middle trapezius 3 5  Lower trapezius 3 5  Elbow flexion 5  5  Elbow extension 5 5  Grip strength (lbs) 115, 110, 105 120, 105, 95  (Blank rows = not tested)   12/07/21: Palpation; muscular tightness noted through cervical spine musculature and Rt shoulder girdle ________________________________________________________________________________________________________________________________  PATIENT EDUCATION: Education details: continuation of HEP Person educated: Patient Education method: Consulting civil engineer,  Media planner, and Handouts Education comprehension: verbalized understanding and returned demonstration   HOME EXERCISE PROGRAM: Ball between hands - place and hold - can vary positions    Ball between hands at chest level, turn upper body to side (twist) toward both sides      Ball at chest height push up overhead, pause and lower      Ball at side hand resting on ball press into ball, hold, release, repeat  Can vary position of ball to work muscles differently     Backwards shoulder rolls      Hold ball between hands and press together, hold, release, repeat  Can vary position of ball to work muscles differently     Reverse wall push up elbows bent to 90 degrees, press lifting chest forward, hold, release, repeat   [employer] on noodle along spine   Access Code: [email or web address] URL: [email or web address] Date: 12/14/2021 Prepared by: Gillermo Murdoch  Exercises - Standing Upper Trapezius Stretch  - 2 x daily - 7 x weekly - 1 sets - 10 reps - Supine Shoulder External Rotation with Dowel  - 1 x daily - 7 x weekly - 1 sets - 10 reps - 3 sec hold - Supine Shoulder Abduction AAROM with Dowel  - 1 x daily - 7 x weekly - 1 sets - 10 reps - 3 sec hold - Supine Shoulder Flexion Extension AAROM with Dowel  - 2 x daily - 7 x weekly - 1 sets - 10 reps - Supine Isometric Shoulder Extension with Towel  - 2 x daily - 7 x weekly - 1 sets - 10-12 reps - 3 seconds hold - Supine Isometric Shoulder Adduction with Towel Roll  - 2 x daily - 7 x weekly - 1 sets - 10-12 reps - 3 seconds hold - Prone Scapular Retraction  - 2 x daily - 7 x weekly - 1 sets - 10-12 reps - 3 seconds hold - Standing Isometric Shoulder Flexion with Doorway - Arm Bent  - 2 x daily - 7 x weekly - 1 sets - 10-12 reps - 3 seconds hold - Standing Isometric Shoulder Internal Rotation at Doorway  - 2 x daily - 7 x weekly - 1 sets - 10-12 reps - 3 seconds hold - Standing Isometric Shoulder External Rotation with  Doorway  - 2 x daily - 7 x weekly - 1 sets - 10-12 reps - 3 seconds hold - Standing Isometric Shoulder Abduction with Doorway - Arm Bent  - 2 x daily - 7 x weekly - 1 sets - 10-12 reps - 3 seconds hold - Supine Shoulder Rhythmic Stabilization- Horizontal Abduction/Adduction  - 2 x daily - 7 x weekly - 1 sets - 5-10 reps - Standing Shoulder Row Reactive Isometric  - 2 x daily - 7 x weekly - 1 sets - 10 reps - 30-45 sec  hold - Shoulder Internal Rotation Reactive Isometrics  - 2 x daily - 7 x weekly - 1 sets - 10 reps - 3-5 sec  hold - Shoulder External Rotation Reactive Isometrics  - 1 x daily - 7 x weekly - 1 sets - 5-10 reps - 10-30 sec  hold ASSESSMENT:  CLINICAL IMPRESSION: Pt with improving shoulder ROM and strength. Able to tolerate reactive isometrics with increased weight. Still only able to tolerate yellow TB -- does not quite have the endurance to perform 10 reps with red TB.   OBJECTIVE IMPAIRMENTS decreased mobility, decreased ROM, decreased strength, hypomobility, increased fascial restrictions, increased muscle spasms, impaired UE functional use, improper body mechanics, and postural dysfunction.   GOALS: Goals reviewed with patient? Yes  SHORT TERM GOALS: Target date: 01/01/2022   Pt will be ind with initial HEP Baseline: Goal status: MET  2.  Pt will be able to obtain at least 90 deg of shoulder flexion and abd for shoulder elevation Baseline:  Goal status: MET  3.  Pt will be able to obtain at least 40 deg of shoulder ER for improved shoulder ROM Baseline:  Goal status: MET   LONG TERM GOALS: Target date: 02/12/2022    Pt will be ind with maintaining and progressing HEP Baseline:  Goal status:IN PROGRESS  2.  Pt will demo L = R shoulder AROM Baseline:  Goal status: IN PROGRESS  3.  Pt will be able to lift and carry at least 25# for IADLs and work tasks Baseline:  Goal status: IN PROGRESS  4.  Pt will be able to lift at least 5# overhead to reach into  cabinets Baseline:  Goal status: IN PROGRESS  5.  Pt will improve FOTO score to >/= 69 Baseline:  Goal status: IN PROGRESS PLAN: PT FREQUENCY: 2x/week  PT DURATION: 12 weeks  PLANNED INTERVENTIONS: Therapeutic exercises, Therapeutic activity, Neuromuscular re-education, Balance training, Gait training, Patient/Family education, Self Care, Joint mobilization, Dry Needling, Electrical stimulation, Cryotherapy, Moist heat, scar mobilization, Taping, Vasopneumatic device, Ultrasound, Ionotophoresis 62m/ml Dexamethasone, Manual therapy, and Re-evaluation  PLAN FOR NEXT SESSION: Continue RC strengthening and scapular strengthening.  Progress A/ROM against gravity and progress to tolerance.   Tiarrah Saville April Ma L Gloria Ricardo, PT, DPT  01/13/2022, 7:18 AM

## 2022-01-15 ENCOUNTER — Ambulatory Visit (INDEPENDENT_AMBULATORY_CARE_PROVIDER_SITE_OTHER): Payer: 59 | Admitting: Surgical

## 2022-01-15 DIAGNOSIS — M25311 Other instability, right shoulder: Secondary | ICD-10-CM

## 2022-01-17 ENCOUNTER — Encounter: Payer: Self-pay | Admitting: Orthopedic Surgery

## 2022-01-17 NOTE — Progress Notes (Signed)
   Post-Op Visit Note   Patient: Steven Chan           Date of Birth: [birth year]           MRN: [id number] Visit Date: 01/15/2022 PCP: Daisy Floro, MD   Assessment & Plan:  Chief Complaint:  Chief Complaint  Patient presents with   Right Shoulder - Routine Post Op   Visit Diagnoses: No diagnosis found.  Plan: Patient is a 26 year old male who presents s/p right shoulder arthroscopic labral repair on 10/20/2021.  Had both anterior and primarily posterior labral repair.  Doing well overall without any problems.  Really does not have any pain.  Not taking any medications for pain or discomfort.  He is able to sleep through the night without difficulty.  Works as an Mudlogger which involves primarily desk work.  Going to physical therapy 2 times per week and doing home exercise program on the off days focusing on passive range of motion.  On exam, has 30 degrees X rotation, 80 degrees abduction, 135 degrees forward flexion.  Incisions are well-healed.  Active nerve is intact with deltoid firing.  Excellent rotator cuff strength of infra, supra, subscap rated 5/5.  No laxity to anterior posterior directed forces.  No apprehension.  Patient has not had any instability episodes or feelings of subluxation.  Plan is to continue with physical therapy to focus on range of motion.  He would like to do some weight lifting which should be okay for below shoulder weight lifting such as bicep curls and tricep extension exercises.  Recommended he hold off on chest press, overhead press, LAT pulldown behind his back, other exercises that places strain on the shoulder.  Follow-up in 2 months for clinical recheck with Dr. August Saucer which will likely be the final check.  Want to see how the weight lifting is going at that point.  Follow-Up Instructions: No follow-ups on file.   Orders:  No orders of the defined types were placed in this encounter.  No orders of the defined types were placed in this  encounter.   Imaging: No results found.  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    Past Medical History:  Diagnosis Date   Anxiety    COVID 07/2019   moderate case - 2 weeks with symptoms   Depression    Seasonal allergies     Family History  Problem Relation Age of Onset   Multiple sclerosis Mother     Past Surgical History:  Procedure Laterality Date   MYRINGOTOMY     SHOULDER ARTHROSCOPY WITH LABRAL REPAIR Right 10/20/2021   Procedure: RIGHT SHOULDER POSTERIOR LABRAL TEAR ARTHROSCOPIC;  Surgeon: Cammy Copa, MD;  Location: [facility] OR;  Service: Orthopedics;  Laterality: Right;   WISDOM TOOTH EXTRACTION     Social History   Occupational History   Not on file  Tobacco Use   Smoking status: Never    Passive exposure: Never   Smokeless tobacco: Never  Vaping Use   Vaping Use: Never used  Substance and Sexual Activity   Alcohol use: No   Drug use: No   Sexual activity: Not on file

## 2022-03-19 ENCOUNTER — Ambulatory Visit: Payer: 59 | Admitting: Orthopedic Surgery

## 2022-03-20 ENCOUNTER — Ambulatory Visit
Admission: EM | Admit: 2022-03-20 | Discharge: 2022-03-20 | Disposition: A | Discharge status: Home / Self Care | Payer: 59 | Attending: Urgent Care | Admitting: Urgent Care

## 2022-03-20 ENCOUNTER — Other Ambulatory Visit: Payer: Self-pay

## 2022-03-20 ENCOUNTER — Encounter: Payer: Self-pay | Admitting: Urgent Care

## 2022-03-20 DIAGNOSIS — K1120 Sialoadenitis, unspecified: Secondary | ICD-10-CM

## 2022-03-20 MED ORDER — AMOXICILLIN-POT CLAVULANATE 875-125 MG PO TABS: 1.0000 | ORAL_TABLET | Freq: Two times a day (BID) | ORAL | 0 refills | Status: AC

## 2022-03-20 NOTE — ED Triage Notes (Signed)
Pt presents to Urgent Care with c/o dry mouth and swollen lymph nodes under his chin/jaw since yesterday. No other s/s.

## 2022-03-20 NOTE — Discharge Instructions (Signed)
We are treating you for an infection of the salivary glands. Please take the antibiotic twice daily with food. Consider purchasing biotene over the counter until your normal salivary gland function resolves. This will restore the hydration of your mouth. Avoid antihistamines over the counter such as zyrtec or allegra. Suck on hard candies such as lemon heads or war heads to increase the function of your salivary glands. If you develop a fever, or worsening swelling/ hardening of the glands, please return for a recheck or head to the ER.

## 2022-03-20 NOTE — ED Provider Notes (Signed)
[facility]    CSN: [id number] Arrival date & time: 03/20/22  [id number]      History   Chief Complaint Chief Complaint  Patient presents with   Lymphadenopathy   dry mouth    HPI Steven Chan is a 27 y.o. male.   Pleasant 27yo male presents today with concern of possible salivary gland infection. He states that yesterday he woke up in the morning feeling as though his mouth was incredibly dry. As the day went on, he also felt like the glands under his jaw were swollen. He used to take OTC antihistamines, but has not been on them for several months at this point. Pt denies sinus pain, nasal drainage, oral ulcerations, sore throat, headache or fever. He denies neck pain or nuchal rigidity. He has never had a salivary gland stone. He does not recall any worse swelling with eating. Pt denies dysphagia, but states the glands feel "tight" with swallowing. No treatments tried. No new Rx medications. No personal hx of autoimmune disease. Pt denies dry eyes or rash. No GI sx. Pt does not smoke.     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 Authorizing Provider  amoxicillin-clavulanate (AUGMENTIN) 875-125 MG tablet Take 1 tablet by mouth 2 (two) times daily with a meal for 10 days. 03/20/22 03/30/22 Yes Yliana Gravois L, PA  b complex  vitamins capsule Take 1 capsule by mouth daily.    [provider]  cholecalciferol (VITAMIN D3) 25 MCG (1000 UNIT) tablet Take 1,000 Units by mouth daily.    [provider]  citalopram (CELEXA) 20 MG tablet Take 20 mg by mouth daily. 08/25/21   [provider]  clindamycin (CLEOCIN T) 1 % lotion Apply 1 Application topically every morning. 05/27/21   [provider]  tretinoin (RETIN-A) 0.025 % cream Apply 1 application  topically at bedtime. 05/27/21   [provider]    Family History Family History  Problem Relation Age of Onset   Multiple sclerosis Mother    Healthy Father     Social History Social History   Tobacco Use   Smoking status: Never    Passive exposure: Never   Smokeless tobacco: Never  Vaping Use   Vaping Use: Never used  Substance Use Topics   Alcohol use: No   Drug use: No     Allergies   Patient has no known allergies.   Review of Systems Review of Systems  HENT:         Dry mouth  Hematological:  Positive for adenopathy.  All other systems reviewed and are negative.    Physical Exam Triage Vital Signs ED Triage Vitals  Enc Vitals Group     BP 03/20/22 0838 113/72  Pulse Rate 03/20/22 0838 70     Resp 03/20/22 0838 18     Temp 03/20/22 0838 98.4 F (36.9 C)     Temp Source 03/20/22 0838 Oral     SpO2 03/20/22 0838 98 %     Weight 03/20/22 0834 165 lb (74.8 kg)     Height 03/20/22 0834 6' 1"$  (1.854 m)     Head Circumference --      Peak Flow --      Pain Score 03/20/22 0834 0     Pain Loc --      Pain Edu? --      Excl. in [place]? --    No data found.  Updated Vital Signs BP 113/72   Pulse 70   Temp 98.4 F (36.9 C) (Oral)   Resp 18   Ht 6' 1"$  (1.854 m)   Wt 165 lb (74.8 kg)   SpO2 98%   BMI 21.77 kg/m   Visual Acuity Right Eye Distance:   Left Eye Distance:   Bilateral Distance:    Right Eye Near:   Left Eye Near:    Bilateral Near:     Physical Exam Vitals and nursing  note reviewed.  Constitutional:      General: He is not in acute distress.    Appearance: Normal appearance. He is well-developed and normal weight. He is not ill-appearing, toxic-appearing or diaphoretic.  HENT:     Head: Normocephalic and atraumatic.     Salivary Glands: Right salivary gland is diffusely enlarged. Left salivary gland is diffusely enlarged.     Right Ear: Tympanic membrane, ear canal and external ear normal. No swelling or tenderness. There is no impacted cerumen. Tympanic membrane is not injected, erythematous or retracted.     Left Ear: Tympanic membrane, ear canal and external ear normal. No swelling or tenderness. There is no impacted cerumen. Tympanic membrane is not injected, erythematous or retracted.     Nose: Nose normal. No congestion or rhinorrhea.     Right Sinus: No maxillary sinus tenderness or frontal sinus tenderness.     Left Sinus: No maxillary sinus tenderness or frontal sinus tenderness.     Mouth/Throat:     Lips: Pink.     Mouth: Mucous membranes are dry. No oral lesions.     Dentition: Normal dentition.     Tongue: No lesions.     Palate: No mass.     Pharynx: Oropharynx is clear. Uvula midline. No pharyngeal swelling, oropharyngeal exudate, posterior oropharyngeal erythema or uvula swelling.     Tonsils: No tonsillar exudate.     Comments: Orifice of submandibular duct appears erythematous and mildly swollen without appearance of stone Eyes:     Conjunctiva/sclera: Conjunctivae normal.  Neck:     Thyroid: No thyroid tenderness.     Trachea: Trachea normal.   Cardiovascular:     Rate and Rhythm: Normal rate and regular rhythm.     Heart sounds: No murmur heard. Pulmonary:     Effort: Pulmonary effort is normal. No respiratory distress.     Breath sounds: Normal breath sounds.  Abdominal:     Palpations: Abdomen is soft.     Tenderness: There is no abdominal tenderness.  Musculoskeletal:        General: No swelling.     Cervical back: Full  passive range of motion without pain, normal range of motion and neck supple. No pain with movement. Normal range of motion.  Lymphadenopathy:     Cervical:  Cervical adenopathy (anterior cervical lymphadenopathy, superior chain portion) present.  Skin:    General: Skin is warm and dry.     Capillary Refill: Capillary refill takes less than 2 seconds.  Neurological:     Mental Status: He is alert.  Psychiatric:        Mood and Affect: Mood normal.      UC Treatments / Results  Labs (all labs ordered are listed, but only abnormal results are displayed) Labs Reviewed - No data to display  EKG   Radiology No results found.  Procedures Procedures (including critical care time)  Medications Ordered in UC Medications - No data to display  Initial Impression / Assessment and Plan / UC Course  I have reviewed the triage vital signs and the nursing notes.  Pertinent labs & imaging results that were available during my care of the patient were reviewed by me and considered in my medical decision making (see chart for details).     Sialoadenitis - pt is without fever or pain to gland at this time, however given appearance would like to cover for possible developing infection. Pt stable for PO abx at this time, VSS. No allergies, will start augmentin BID. Encouraged pt to also suck on sour candies to help stimulate salivation in the instance of possible stone. ER precautions reviewed.   Final Clinical Impressions(s) / UC Diagnoses   Final diagnoses:  Sialoadenitis     Discharge Instructions      We are treating you for an infection of the salivary glands. Please take the antibiotic twice daily with food. Consider purchasing biotene over the counter until your normal salivary gland function resolves. This will restore the hydration of your mouth. Avoid antihistamines over the counter such as zyrtec or allegra. Suck on hard candies such as lemon heads or war heads to increase  the function of your salivary glands. If you develop a fever, or worsening swelling/ hardening of the glands, please return for a recheck or head to the ER.   ED Prescriptions     Medication Sig Dispense Auth. Provider   amoxicillin-clavulanate (AUGMENTIN) 875-125 MG tablet Take 1 tablet by mouth 2 (two) times daily with a meal for 10 days. 20 tablet Domanik [place] L, [place]      PDMP not reviewed this encounter.   Chaney Malling, [place] 03/20/22 [phone]

## 2022-04-07 ENCOUNTER — Encounter: Payer: Self-pay | Admitting: Orthopedic Surgery

## 2022-04-07 ENCOUNTER — Ambulatory Visit (INDEPENDENT_AMBULATORY_CARE_PROVIDER_SITE_OTHER): Payer: 59 | Admitting: Orthopedic Surgery

## 2022-04-07 DIAGNOSIS — M25311 Other instability, right shoulder: Secondary | ICD-10-CM

## 2022-04-07 NOTE — Progress Notes (Signed)
   Post-Op Visit Note   Patient: Steven Chan           Date of Birth: 1996-01-26           MRN: [id number] Visit Date: 04/07/2022 PCP: Lawerance Cruel, MD   Assessment & Plan:  Chief Complaint:  Chief Complaint  Patient presents with   Right Shoulder - Routine Post Op    10/20/21 (45m16d)Right Shoulder Posterior Labral Tear Arthroscopic      Visit Diagnoses: No diagnosis found.  Plan: [name] a 27year old patient is about 5 months out from right shoulder posterior and anterior labral tear repair.  Finished physical therapy.  He states he is almost 100%.  Has some occasional popping in the shoulder with overhead motion.  He wants to start doing some weightlifting.  On examination he has good range of motion to about 40 of external rotation with good endpoint.  Shoulder stability is 1+ anterior posterior with good endpoint less than a centimeter sulcus sign.  Rotator cuff strength is excellent infraspinatus supraspinatus and subscap muscle testing with negative O'Brien's testing.  Plan at this time is to let him start doing some modified weightlifting.  Would not want him really going to far back on chest press and also would like for him to keep his hands on the machine chest press below the or at the level of his nipples.  Overhead pulldown is okay with wide grip but always keeping the bar in front of him.  Biceps triceps okay.  Would start with light weight and then workup.  Follow-up as needed.  Follow-Up Instructions: No follow-ups on file.   Orders:  No orders of the defined types were placed in this encounter.  No orders of the defined types were placed in this encounter.   Imaging: No results found.  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    Past Medical History:  Diagnosis Date   Anxiety    COVID 07/2019   moderate case - 2 weeks with symptoms   Depression     Seasonal allergies     Family History  Problem Relation Age of Onset   Multiple sclerosis Mother    Healthy Father     Past Surgical History:  Procedure Laterality Date   MYRINGOTOMY     SHOULDER ARTHROSCOPY WITH LABRAL REPAIR Right 10/20/2021   Procedure: RIGHT SHOULDER POSTERIOR LABRAL TEAR ARTHROSCOPIC;  Surgeon: DMeredith Pel MD;  Location: [facility]  Service: Orthopedics;  Laterality: Right;   WISDOM TOOTH EXTRACTION     Social History   Occupational History   Not on file  Tobacco Use   Smoking status: Never    Passive exposure: Never   Smokeless tobacco: Never  Vaping Use   Vaping Use: Never used  Substance and Sexual Activity   Alcohol use: No   Drug use: No   Sexual activity: Not on file

## 2022-04-10 ENCOUNTER — Encounter: Payer: Self-pay | Admitting: Emergency Medicine

## 2022-04-10 ENCOUNTER — Ambulatory Visit
Admission: EM | Admit: 2022-04-10 | Discharge: 2022-04-10 | Disposition: A | Discharge status: Home / Self Care | Payer: 59 | Attending: Family Medicine | Admitting: Family Medicine

## 2022-04-10 DIAGNOSIS — J029 Acute pharyngitis, unspecified: Secondary | ICD-10-CM | POA: Diagnosis not present

## 2022-04-10 LAB — POCT INFLUENZA A/B
Influenza A, POC: NEGATIVE
Influenza B, POC: NEGATIVE

## 2022-04-10 LAB — POC SARS CORONAVIRUS 2 AG -  ED: SARS Coronavirus 2 Ag: NEGATIVE

## 2022-04-10 NOTE — Discharge Instructions (Signed)
The flu and COVID tests are both negative You have a viral respiratory infection  Consider using an antibacterial lotion or a lotion type skin sanitizer to prevent drying of skin on the hands

## 2022-04-10 NOTE — ED Triage Notes (Signed)
Scratchy sore throat since last night night  Denies fever  Has been around  a friend who has been sick No OTC meds  Would like a flu & COVID test

## 2022-04-11 NOTE — ED Provider Notes (Signed)
[facility]    CSN: [id number] Arrival date & time: 04/10/22  1442      History   Chief Complaint Chief Complaint  Patient presents with   Sore Throat    HPI Steven Chan is a 27 y.o. male.   HPI  Patient is here requesting flu and COVID test.  He is around sick individuals earlier in the week.  Now he has a scratchy sore throat since last night and some runny nose.  Feels achy.  No fever.  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 Authorizing Provider  ascorbic acid (VITAMIN C) 250 MG CHEW 1 tablet Orally Once a day    [provider]  b complex vitamins capsule Take 1 capsule by mouth daily.    [provider]  cholecalciferol (VITAMIN D3) 25 MCG (1000 UNIT) tablet Take 1,000 Units by mouth daily.    [provider]  citalopram (CELEXA) 20 MG tablet Take 20 mg by mouth daily. 08/25/21   [provider]  clindamycin (CLEOCIN T) 1 % lotion Apply 1 Application topically every morning. 05/27/21   [provider]  tretinoin (RETIN-A) 0.025 % cream Apply 1 application  topically at bedtime. 05/27/21   [provider]    Family History Family History   Problem Relation Age of Onset   Multiple sclerosis Mother    Healthy Father     Social History Social History   Tobacco Use   Smoking status: Never    Passive exposure: Never   Smokeless tobacco: Never  Vaping Use   Vaping Use: Never used  Substance Use Topics   Alcohol use: No   Drug use: No     Allergies   Patient has no known allergies.   Review of Systems Review of Systems See HPI  Physical Exam Triage Vital Signs ED Triage Vitals  Enc Vitals Group     BP 04/10/22 1507 123/79     Pulse Rate 04/10/22 1507 91     Resp 04/10/22 1507 14     Temp 04/10/22 1507 98 F (36.7 C)     Temp Source 04/10/22 1507 Oral     SpO2 04/10/22 1507 97 %     Weight 04/10/22 1509 180 lb (81.6 kg)     Height 04/10/22 1509 '6\' 1"'$  (1.854 m)     Head Circumference --      Peak Flow --      Pain Score 04/10/22 1509 0     Pain Loc --      Pain Edu? --      Excl. in [place]? --  No data found.  Updated Vital Signs BP 123/79 (BP Location: Right Arm)   Pulse 91   Temp 98 F (36.7 C) (Oral)   Resp 14   Ht '6\' 1"'$  (1.854 m)   Wt 81.6 kg   SpO2 97%   BMI 23.75 kg/m       Physical Exam Constitutional:      General: He is not in acute distress.    Appearance: He is well-developed. He is ill-appearing.  HENT:     Head: Normocephalic and atraumatic.     Mouth/Throat:     Mouth: Mucous membranes are moist.     Pharynx: Uvula midline. Posterior oropharyngeal erythema present. No pharyngeal swelling.     Tonsils: No tonsillar exudate. 1+ on the right. 1+ on the left.  Eyes:     Conjunctiva/sclera: Conjunctivae normal.     Pupils: Pupils are equal, round, and reactive to light.  Cardiovascular:     Rate and Rhythm: Normal rate.  Pulmonary:     Effort: Pulmonary effort is normal. No respiratory distress.  Abdominal:     General: There is no distension.     Palpations: Abdomen is soft.  Musculoskeletal:        General: Normal range of motion.     Cervical back: Normal range of  motion.  Lymphadenopathy:     Cervical: No cervical adenopathy.  Skin:    General: Skin is warm and dry.  Neurological:     Mental Status: He is alert.      UC Treatments / Results  Labs (all labs ordered are listed, but only abnormal results are displayed) Labs Reviewed  POC SARS CORONAVIRUS 2 AG -  ED  POCT INFLUENZA A/B    EKG   Radiology No results found.  Procedures Procedures (including critical care time)  Medications Ordered in UC Medications - No data to display  Initial Impression / Assessment and Plan / UC Course  I have reviewed the triage vital signs and the nursing notes.  Pertinent labs & imaging results that were available during my care of the patient were reviewed by me and considered in my medical decision making (see chart for details).     Final Clinical Impressions(s) / UC Diagnoses   Final diagnoses:  Viral pharyngitis     Discharge Instructions      The flu and COVID tests are both negative You have a viral respiratory infection  Consider using an antibacterial lotion or a lotion type skin sanitizer to prevent drying of skin on the hands   ED Prescriptions   None    PDMP not reviewed this encounter.   Raylene Everts, MD 04/11/22 [phone]

## 2022-04-12 ENCOUNTER — Ambulatory Visit
Admission: EM | Admit: 2022-04-12 | Discharge: 2022-04-12 | Disposition: A | Discharge status: Home / Self Care | Payer: 59 | Attending: Urgent Care | Admitting: Urgent Care

## 2022-04-12 DIAGNOSIS — J040 Acute laryngitis: Secondary | ICD-10-CM

## 2022-04-12 DIAGNOSIS — R0981 Nasal congestion: Secondary | ICD-10-CM

## 2022-04-12 DIAGNOSIS — J069 Acute upper respiratory infection, unspecified: Secondary | ICD-10-CM | POA: Diagnosis not present

## 2022-04-12 MED ORDER — DEXAMETHASONE 6 MG PO TABS: 6.0000 mg | ORAL_TABLET | Freq: Every day | ORAL | 0 refills | Status: AC

## 2022-04-12 MED ORDER — BENZONATATE 100 MG PO CAPS: 100.0000 mg | ORAL_CAPSULE | Freq: Three times a day (TID) | ORAL | 0 refills | Status: DC | PRN

## 2022-04-12 MED ORDER — FLUTICASONE PROPIONATE 50 MCG/ACT NA SUSP: 1.0000 | Freq: Two times a day (BID) | NASAL | 0 refills | Status: AC

## 2022-04-12 NOTE — ED Triage Notes (Signed)
Pt c/o non productive cough and congestion since Friday. Was seen here in UC on Saturday. COVID and flu neg. Sxs worsening since. Taking Mucinex and sudafed prn.

## 2022-04-12 NOTE — ED Provider Notes (Signed)
[facility]    CSN: [id number] Arrival date & time: 04/12/22  1312      History   Chief Complaint Chief Complaint  Patient presents with   Cough   Nasal Congestion    HPI Steven Chan is a 27 y.o. male.   27 year old male presents today due to concerns of nasal congestion and cough.  Symptoms started on Friday.  He was seen the following day in our urgent care and tested for COVID and flu, both of which were negative.  Patient states his symptoms have worsened, and his nasal congestion and drainage have become increasingly productive.  He woke up this morning with a very barky cough.  He also reports a scratchy throat. Losing his voice today. He denies a fever.  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 Authorizing Provider  benzonatate (TESSALON) 100 MG capsule Take 1 capsule (100 mg total) by mouth 3 (three) times daily as needed for cough. 04/12/22  Yes Blanca Carreon L, PA  dexamethasone (DECADRON) 6 MG tablet Take 1 tablet (6 mg total) by mouth daily for 5 days. 04/12/22 04/17/22 Yes Ras Kollman L, PA  fluticasone (FLONASE) 50 MCG/ACT nasal spray Place 1 spray into both nostrils in the morning and at bedtime. 04/12/22  Yes Jerrian Mells L,  PA  ascorbic acid (VITAMIN C) 250 MG CHEW 1 tablet Orally Once a day    [provider]  b complex vitamins capsule Take 1 capsule by mouth daily.    [provider]  cholecalciferol (VITAMIN D3) 25 MCG (1000 UNIT) tablet Take 1,000 Units by mouth daily.    [provider]  citalopram (CELEXA) 20 MG tablet Take 20 mg by mouth daily. 08/25/21   [provider]  clindamycin (CLEOCIN T) 1 % lotion Apply 1 Application topically every morning. 05/27/21   [provider]  tretinoin (RETIN-A) 0.025 % cream Apply 1 application  topically at bedtime. 05/27/21   [provider]    Family History Family History  Problem Relation Age of Onset   Multiple sclerosis Mother    Healthy Father     Social History Social History   Tobacco Use   Smoking status: Never    Passive exposure: Never   Smokeless tobacco: Never  Vaping Use   Vaping Use: Never used  Substance Use Topics   Alcohol use: No   Drug use: No     Allergies   Patient has no known allergies.   Review of Systems Review of Systems  Respiratory:  Positive for cough.   As per HPI   Physical Exam Triage Vital Signs ED Triage Vitals [04/12/22 1403]  Enc Vitals Group     BP      Pulse      Resp      Temp      Temp src      SpO2      Weight      Height      Head Circumference      Peak Flow      Pain Score 0     Pain Loc      Pain Edu?      Excl. in [place]?    No data found.  Updated Vital Signs There were no vitals taken for this visit.  Visual Acuity Right Eye Distance:   Left Eye Distance:   Bilateral Distance:    Right Eye Near:   Left Eye Near:    Bilateral Near:     Physical Exam Vitals and nursing note reviewed.  Constitutional:      General: He is not in acute distress.    Appearance: Normal appearance. He is well-developed. He is not ill-appearing, toxic-appearing or diaphoretic.     Comments: Hoarse voice  HENT:     Head: Normocephalic and  atraumatic.     Jaw: There is normal jaw occlusion.     Salivary Glands: Right salivary gland is not diffusely enlarged or tender. Left salivary gland is not diffusely enlarged or tender.     Right Ear: Ear canal and external ear normal. A middle ear effusion is present. Tympanic membrane is not injected, scarred, perforated, erythematous, retracted or bulging.     Left Ear: Ear canal and external ear normal. A middle ear effusion is present. Tympanic membrane is not injected, scarred, perforated, erythematous, retracted or bulging.     Nose: Congestion and rhinorrhea present. No nasal tenderness. Rhinorrhea is clear.     Right Nostril: No epistaxis.     Left Nostril: No epistaxis.     Right Turbinates: Swollen. Not enlarged.     Left Turbinates: Swollen. Not enlarged.     Right Sinus: No maxillary sinus tenderness or frontal sinus tenderness.     Left Sinus: No maxillary sinus tenderness or frontal sinus tenderness.     Mouth/Throat:     Lips: Pink.     Mouth: Mucous membranes are moist. No injury, lacerations, oral lesions or angioedema.     Dentition: Normal dentition.     Tongue: No lesions. Tongue does not deviate from midline.     Palate: No mass and lesions.     Pharynx: Oropharynx is clear. Uvula midline. No pharyngeal swelling, oropharyngeal exudate, posterior oropharyngeal erythema or uvula swelling.     Tonsils: No tonsillar exudate or tonsillar abscesses.  Eyes:     General: Lids are normal. No scleral icterus.       Right eye: No discharge.        Left eye: No discharge.     Conjunctiva/sclera: Conjunctivae normal.     Right eye: Right conjunctiva is not injected.     Left eye: Left conjunctiva is not injected.  Cardiovascular:     Rate and Rhythm: Normal rate and regular rhythm.     Heart sounds: No murmur heard. Pulmonary:     Effort: Pulmonary effort is normal. No respiratory distress.     Breath sounds: Normal breath sounds. No stridor. No wheezing, rhonchi or rales.   Chest:     Chest wall: No tenderness.  Musculoskeletal:        General: No swelling.  Cervical back: Normal range of motion and neck supple.  Lymphadenopathy:     Cervical: No cervical adenopathy.  Skin:    General: Skin is warm and dry.     Capillary Refill: Capillary refill takes less than 2 seconds.     Findings: No rash.  Neurological:     Mental Status: He is alert.  Psychiatric:        Mood and Affect: Mood normal.      UC Treatments / Results  Labs (all labs ordered are listed, but only abnormal results are displayed) Labs Reviewed - No data to display  EKG   Radiology No results found.  Procedures Procedures (including critical care time)  Medications Ordered in UC Medications - No data to display  Initial Impression / Assessment and Plan / UC Course  I have reviewed the triage vital signs and the nursing notes.  Pertinent labs & imaging results that were available during my care of the patient were reviewed by me and considered in my medical decision making (see chart for details).     Acute viral URI -supportive measures with Flonase and increased hydration encouraged.  Patient may continue with Mucinex. Nasal congestion -treatment as above. Laryngitis -viral versus postnasal drip in nature.  Will do short course of Decadron   Final Clinical Impressions(s) / UC Diagnoses   Final diagnoses:  Acute upper respiratory infection  Nasal congestion  Laryngitis     Discharge Instructions      You have a viral upper respiratory infection. Please start taking the prednisone as prescribed this will help with your laryngitis. Please use the Flonase to help with your nasal congestion and postnasal drainage. Use the cough pill primarily at nighttime to help you sleep, but can be used up to three times daily as needed     ED Prescriptions     Medication Sig Dispense Auth. Provider   dexamethasone (DECADRON) 6 MG tablet Take 1 tablet (6 mg total)  by mouth daily for 5 days. 5 tablet Anihya Tuma L, PA   fluticasone (FLONASE) 50 MCG/ACT nasal spray Place 1 spray into both nostrils in the morning and at bedtime. 16 mL Balin Vandegrift L, PA   benzonatate (TESSALON) 100 MG capsule Take 1 capsule (100 mg total) by mouth 3 (three) times daily as needed for cough. 21 capsule Emani Morad L, PA      PDMP not reviewed this encounter.   Chaney Malling, [place] 04/12/22 2042

## 2022-04-12 NOTE — Discharge Instructions (Addendum)
You have a viral upper respiratory infection. Please start taking the prednisone as prescribed this will help with your laryngitis. Please use the Flonase to help with your nasal congestion and postnasal drainage. Use the cough pill primarily at nighttime to help you sleep, but can be used up to three times daily as needed

## 2022-05-14 ENCOUNTER — Ambulatory Visit: Payer: 59

## 2022-07-25 ENCOUNTER — Ambulatory Visit
Admission: EM | Admit: 2022-07-25 | Discharge: 2022-07-25 | Disposition: A | Discharge status: Home / Self Care | Payer: 59 | Attending: Family Medicine | Admitting: Family Medicine

## 2022-07-25 ENCOUNTER — Encounter: Payer: Self-pay | Admitting: Emergency Medicine

## 2022-07-25 ENCOUNTER — Other Ambulatory Visit: Payer: Self-pay

## 2022-07-25 DIAGNOSIS — R059 Cough, unspecified: Secondary | ICD-10-CM

## 2022-07-25 DIAGNOSIS — J069 Acute upper respiratory infection, unspecified: Secondary | ICD-10-CM

## 2022-07-25 DIAGNOSIS — J309 Allergic rhinitis, unspecified: Secondary | ICD-10-CM

## 2022-07-25 MED ORDER — BENZONATATE 200 MG PO CAPS: 200.0000 mg | ORAL_CAPSULE | Freq: Three times a day (TID) | ORAL | 0 refills | Status: AC | PRN

## 2022-07-25 MED ORDER — AZITHROMYCIN 250 MG PO TABS: 250.0000 mg | ORAL_TABLET | Freq: Every day | ORAL | 0 refills | Status: DC

## 2022-07-25 NOTE — ED Provider Notes (Signed)
[facility]    CSN: [id number] Arrival date & time: 07/25/22  1046      History   Chief Complaint Chief Complaint  Patient presents with   Cough    HPI Steven Chan is a 27 y.o. male.   HPI 27 year old male presents with cough for 1 day.  Reports coughing up yellowish phlegm and has sick taste in his mouth.  Reports taking OTC Mucinex and Allegra.  PMH significant for recurrent subluxation of shoulder with multidirectional instability and depression.  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 Authorizing Provider  ascorbic acid (VITAMIN C) 250 MG CHEW 1 tablet Orally Once a day   Yes [provider]  azithromycin (ZITHROMAX) 250 MG tablet Take 1 tablet (250 mg total) by mouth daily. Take first 2 tablets together, then 1 every day until finished. 07/25/22  Yes Trevor Iha, FNP  b complex vitamins capsule Take 1 capsule by mouth daily.   Yes [provider]  benzonatate (TESSALON) 200 MG capsule Take 1 capsule (200 mg total) by mouth 3 (three) times daily as needed for up to 7 days. 07/25/22 08/01/22 Yes Trevor Iha, FNP  cholecalciferol (VITAMIN D3) 25 MCG (1000 UNIT) tablet Take 1,000 Units by mouth daily.   Yes [provider]  citalopram (CELEXA) 20 MG tablet Take 20 mg by mouth daily.  08/25/21  Yes [provider]  clindamycin (CLEOCIN T) 1 % lotion Apply 1 Application topically every morning. 05/27/21  Yes [provider]  tretinoin (RETIN-A) 0.025 % cream Apply 1 application  topically at bedtime. 05/27/21  Yes [provider]  fluticasone (FLONASE) 50 MCG/ACT nasal spray Place 1 spray into both nostrils in the morning and at bedtime. 04/12/22   Maretta Bees, PA    Family History Family History  Problem Relation Age of Onset   Multiple sclerosis Mother    Healthy Father     Social History Social History   Tobacco Use   Smoking status: Never    Passive exposure: Never   Smokeless tobacco: Never  Vaping Use   Vaping Use: Never used  Substance Use Topics   Alcohol use: No   Drug use: No     Allergies   Patient has no known allergies.   Review of Systems Review of Systems  HENT:  Positive for postnasal drip.   Respiratory:  Positive for cough.   All other systems reviewed and are negative.    Physical Exam Triage Vital Signs ED Triage Vitals  Enc Vitals Group     BP      Pulse      Resp      Temp      Temp src      SpO2  Weight      Height      Head Circumference      Peak Flow      Pain Score      Pain Loc      Pain Edu?      Excl. in [place]?    No data found.  Updated Vital Signs BP 129/79 (BP Location: Right Arm)   Pulse 65   Temp 98 F (36.7 C) (Oral)   Resp 16   Ht 6\' 1"  (1.854 m)   Wt 192 lb (87.1 kg)   SpO2 98%   BMI 25.33 kg/m     Physical Exam Vitals and nursing note reviewed.  Constitutional:      Appearance: Normal appearance. He is normal weight.  HENT:     Head: Normocephalic and atraumatic.     Right Ear: Tympanic membrane, ear canal and external ear normal.     Left Ear: Tympanic membrane, ear canal and external ear normal.     Mouth/Throat:     Mouth: Mucous membranes are moist.     Pharynx: Oropharynx is clear.     Comments: Mild to moderate clear drainage of posterior  oropharynx noted Eyes:     Extraocular Movements: Extraocular movements intact.     Conjunctiva/sclera: Conjunctivae normal.     Pupils: Pupils are equal, round, and reactive to light.  Cardiovascular:     Rate and Rhythm: Normal rate and regular rhythm.     Pulses: Normal pulses.     Heart sounds: Normal heart sounds.  Pulmonary:     Effort: Pulmonary effort is normal.     Breath sounds: Normal breath sounds. No wheezing, rhonchi or rales.     Comments: Infrequent nonproductive cough noted on exam Musculoskeletal:        General: Normal range of motion.     Cervical back: Normal range of motion and neck supple.  Skin:    General: Skin is warm and dry.  Neurological:     General: No focal deficit present.     Mental Status: He is alert and oriented to person, place, and time. Mental status is at baseline.  Psychiatric:        Mood and Affect: Mood normal.        Behavior: Behavior normal.        Thought Content: Thought content normal.      UC Treatments / Results  Labs (all labs ordered are listed, but only abnormal results are displayed) Labs Reviewed - No data to display  EKG   Radiology No results found.  Procedures Procedures (including critical care time)  Medications Ordered in UC Medications - No data to display  Initial Impression / Assessment and Plan / UC Course  I have reviewed the triage vital signs and the nursing notes.  Pertinent labs & imaging results that were available during my care of the patient were reviewed by me and considered in my medical decision making (see chart for details).     MDM: 1.  Acute URI-Rx'd Zithromax) 500 mg day 1, then 250 mg daily x 4 days); 2.  Cough, unspecified type-Rx'd Tessalon Perles 200 mg 3 times daily, as needed; 3.  Allergic rhinitis, unspecified seasonality, unspecified trigger-advised patient to take OTC Allegra 180 mg fexofenadine without D daily for 5 days. Instructed patient to take medication as directed  with food to completion.  Advised patient to take OTC Allegra 180 mg with Zithromax daily for the next 5 days.  Advised patient may take Tessalon Perles daily or as needed for cough.  Encouraged increase daily water intake to 64 ounces per day while taking these medications.  Advised if symptoms worsen and/or unresolved please follow-up with PCP or here for further evaluation.  Patient discharged home, hemodynamically stable.  Final Clinical Impressions(s) / UC Diagnoses   Final diagnoses:  Acute URI  Cough, unspecified type  Allergic rhinitis, unspecified seasonality, unspecified trigger     Discharge Instructions      Instructed patient to take medication as directed with food to completion.  Advised patient to take OTC Allegra 180 mg with Zithromax daily for the next 5 days.  Advised patient may take Tessalon Perles daily or as needed for cough.  Encouraged increase daily water intake to 64 ounces per day while taking these medications.  Advised if symptoms worsen and/or unresolved please follow-up with PCP or here for further evaluation.     ED Prescriptions     Medication Sig Dispense Auth. Provider   azithromycin (ZITHROMAX) 250 MG tablet Take 1 tablet (250 mg total) by mouth daily. Take first 2 tablets together, then 1 every day until finished. 6 tablet Trevor Iha, FNP   benzonatate (TESSALON) 200 MG capsule Take 1 capsule (200 mg total) by mouth 3 (three) times daily as needed for up to 7 days. 40 capsule Trevor Iha, FNP      PDMP not reviewed this encounter.   Trevor Iha, FNP 07/25/22 1130

## 2022-07-25 NOTE — Discharge Instructions (Addendum)
Instructed patient to take medication as directed with food to completion.  Advised patient to take OTC Allegra 180 mg with Zithromax daily for the next 5 days.  Advised patient may take Tessalon Perles daily or as needed for cough.  Encouraged increase daily water intake to 64 ounces per day while taking these medications.  Advised if symptoms worsen and/or unresolved please follow-up with PCP or here for further evaluation.

## 2022-07-25 NOTE — ED Triage Notes (Signed)
Patient c/o cough x 1 day.  This morning he coughed up yellowish phlegm and has a "sick" taste in his mouth.  No other sx's.  Patient has taken Mucinex and Allegra.

## 2022-10-15 ENCOUNTER — Ambulatory Visit
Admission: RE | Admit: 2022-10-15 | Discharge: 2022-10-15 | Disposition: A | Discharge status: Home / Self Care | Payer: 59 | Source: Ambulatory Visit

## 2022-10-15 VITALS — BP 145/76 | HR 70 | Temp 98.1°F | Resp 16 | Ht 73.0 in | Wt 190.0 lb

## 2022-10-15 DIAGNOSIS — R590 Localized enlarged lymph nodes: Secondary | ICD-10-CM

## 2022-10-15 DIAGNOSIS — H6691 Otitis media, unspecified, right ear: Secondary | ICD-10-CM

## 2022-10-15 MED ORDER — AMOXICILLIN-POT CLAVULANATE 875-125 MG PO TABS: 1.0000 | ORAL_TABLET | Freq: Two times a day (BID) | ORAL | 0 refills | Status: AC

## 2022-10-15 NOTE — ED Provider Notes (Signed)
[name]    CSN: [id number] Arrival date & time: 10/15/22  1732      History   Chief Complaint Chief Complaint  Patient presents with   Ear Fullness    Swollen lymph node behind right ear. Ear feels full, slight pain. - Entered by patient    HPI Steven Chan is a 27 y.o. male.   HPI 27 year old male presents with ear fullness for 2 days.  Patient is concerned with swollen lymph node behind the right ear with right ear pain.  PMH significant for depression and acne vulgaris.  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 Authorizing Provider  amoxicillin-clavulanate (AUGMENTIN) 875-125 MG tablet Take 1 tablet by mouth 2 (two) times daily for 10 days. 10/15/22 10/25/22 Yes Trevor Iha, FNP  ascorbic acid (VITAMIN C) 250 MG CHEW 1 tablet Orally Once a day   Yes [provider]  b complex vitamins capsule Take 1 capsule by mouth daily.   Yes [provider]  cholecalciferol (VITAMIN D3) 25 MCG (1000 UNIT) tablet Take 1,000 Units by mouth daily.   Yes [provider]  citalopram (CELEXA) 20 MG tablet Take 20 mg by mouth daily. 08/25/21  Yes [provider]  clindamycin (CLEOCIN T) 1 % lotion Apply 1 Application topically every morning. 05/27/21  Yes [provider]  Fexofenadine HCl  (ALLEGRA PO) Take by mouth.   Yes [provider]  tretinoin (RETIN-A) 0.025 % cream Apply 1 application  topically at bedtime. 05/27/21  Yes [provider]  fluticasone (FLONASE) 50 MCG/ACT nasal spray Place 1 spray into both nostrils in the morning and at bedtime. 04/12/22   Maretta Bees, PA    Family History Family History  Problem Relation Age of Onset   Multiple sclerosis Mother    Healthy Father     Social History Social History   Tobacco Use   Smoking status: Never    Passive exposure: Never   Smokeless tobacco: Never  Vaping Use   Vaping status: Never Used  Substance Use Topics   Alcohol use: No   Drug use: No     Allergies   Patient has no known allergies.   Review of Systems Review of Systems  HENT:  Positive for ear pain.      Physical Exam Triage Vital Signs ED Triage Vitals  Encounter Vitals Group     BP      Systolic BP Percentile      Diastolic BP Percentile      Pulse      Resp      Temp      Temp src      SpO2      Weight      Height  Head Circumference      Peak Flow      Pain Score      Pain Loc      Pain Education      Exclude from Growth Chart    No data found.  Updated Vital Signs BP (!) 145/76 (BP Location: Right Arm)   Pulse 70   Temp 98.1 F (36.7 C) (Oral)   Resp 16   Ht 6\' 1"  (1.854 m)   Wt 190 lb (86.2 kg)   SpO2 98%   BMI 25.07 kg/m      Physical Exam Vitals and nursing note reviewed.  Constitutional:      Appearance: Normal appearance. He is normal weight.  HENT:     Head: Normocephalic and atraumatic.     Right Ear: Ear canal and external ear normal.     Left Ear: Tympanic membrane, ear canal and external ear normal.     Ears:     Comments: Right TM: Red rimmed, erythematous, bulging; tiny (<0.5 cm) mildly TTP lymph node noted on exam     Mouth/Throat:     Mouth: Mucous membranes are moist.     Pharynx: Oropharynx is clear.  Eyes:     Extraocular Movements: Extraocular  movements intact.     Conjunctiva/sclera: Conjunctivae normal.     Pupils: Pupils are equal, round, and reactive to light.  Cardiovascular:     Rate and Rhythm: Normal rate and regular rhythm.     Pulses: Normal pulses.     Heart sounds: Normal heart sounds.  Pulmonary:     Effort: Pulmonary effort is normal.     Breath sounds: Normal breath sounds. No wheezing, rhonchi or rales.  Musculoskeletal:        General: Normal range of motion.     Cervical back: Normal range of motion and neck supple.  Skin:    General: Skin is warm and dry.  Neurological:     General: No focal deficit present.     Mental Status: He is alert and oriented to person, place, and time. Mental status is at baseline.  Psychiatric:        Mood and Affect: Mood normal.        Behavior: Behavior normal.        Thought Content: Thought content normal.      UC Treatments / Results  Labs (all labs ordered are listed, but only abnormal results are displayed) Labs Reviewed - No data to display  EKG   Radiology No results found.  Procedures Procedures (including critical care time)  Medications Ordered in UC Medications - No data to display  Initial Impression / Assessment and Plan / UC Course  I have reviewed the triage vital signs and the nursing notes.  Pertinent labs & imaging results that were available during my care of the patient were reviewed by me and considered in my medical decision making (see chart for details).     MDM: 1.  Acute right otitis media-Rx'd Augmentin 875/125 mg tablet twice daily x 10 days; 2.  Postauricular lymphadenopathy-tiny (<0.5 cm) mildly TTP lymph node noted on exam. Advised patient to take medication as directed with food to completion.  Encouraged to increase daily water intake to 64 ounces per day while taking his medication.  Advised patient not to submerge head underwater for the next 10 to 14 days.  Advised if symptoms worsen and/or unresolved please follow-up  with PCP, ENT or here for further evaluation.  Patient  discharged home, hemodynamically stable. Final Clinical Impressions(s) / UC Diagnoses   Final diagnoses:  Acute right otitis media  Postauricular lymphadenopathy     Discharge Instructions      Advised patient to take medication as directed with food to completion.  Encouraged to increase daily water intake to 64 ounces per day while taking his medication.  Advised patient not to submerge head underwater for the next 10 to 14 days.  Advised if symptoms worsen and/or unresolved please follow-up with PCP, ENT or here for further evaluation.     ED Prescriptions     Medication Sig Dispense Auth. Provider   amoxicillin-clavulanate (AUGMENTIN) 875-125 MG tablet Take 1 tablet by mouth 2 (two) times daily for 10 days. 20 tablet Trevor Iha, FNP      PDMP not reviewed this encounter.   Trevor Iha, FNP 10/15/22 [phone]

## 2022-10-15 NOTE — ED Triage Notes (Signed)
Patient c/o a swollen lymph node behind his right ear since yesterday.  Since then, there's been a fullness feeling in his right ear.  Denies pain, no sore throat or nasal drainage.  Patient is currently taken Allegra.  COVID test at home was negative yesterday.

## 2022-10-15 NOTE — Discharge Instructions (Addendum)
Advised patient to take medication as directed with food to completion.  Encouraged to increase daily water intake to 64 ounces per day while taking his medication.  Advised patient not to submerge head underwater for the next 10 to 14 days.  Advised if symptoms worsen and/or unresolved please follow-up with PCP, ENT or here for further evaluation.

## 2022-10-28 ENCOUNTER — Encounter: Payer: Self-pay | Admitting: Emergency Medicine

## 2022-10-28 ENCOUNTER — Ambulatory Visit
Admission: EM | Admit: 2022-10-28 | Discharge: 2022-10-28 | Disposition: A | Discharge status: Home / Self Care | Payer: 59

## 2022-10-28 DIAGNOSIS — S61217A Laceration without foreign body of left little finger without damage to nail, initial encounter: Secondary | ICD-10-CM | POA: Diagnosis not present

## 2022-10-28 NOTE — ED Provider Notes (Signed)
[name]    CSN: [id number] Arrival date & time: 10/28/22  1824      History   Chief Complaint Chief Complaint  Patient presents with   Laceration    HPI JABIER CULHANE is a 27 y.o. male.   BRAXTAN LACAP is a 27 y.o. male presenting for chief complaint of cut to the lateral aspect of the left fifth finger that happened greater than 24 hours ago. He accidentally cut his left pinky finger on a rusty pipe yesterday. Wound bled initially, bleeding controlled with pressure. No numbness/tingling distally to injury, no nail involvement. He is able to provide documentation showing Tdap up to date in the last 5 years (2021).      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 Authorizing Provider  ascorbic acid (VITAMIN C) 250 MG CHEW 1 tablet Orally Once a day   Yes [provider]  b complex vitamins capsule Take 1 capsule by mouth daily.   Yes [provider]  cholecalciferol (VITAMIN D3) 25 MCG (1000 UNIT) tablet Take 1,000 Units by mouth daily.   Yes [provider]  citalopram (CELEXA) 20 MG tablet Take 20 mg by mouth daily. 08/25/21  Yes [provider]  clindamycin (CLEOCIN T) 1 % lotion Apply 1 Application topically every morning. 05/27/21  Yes [provider]  Fexofenadine  HCl (ALLEGRA PO) Take by mouth.   Yes [provider]  fluticasone (FLONASE) 50 MCG/ACT nasal spray Place 1 spray into both nostrils in the morning and at bedtime. 04/12/22  Yes Crain, Whitney L, PA  tretinoin (RETIN-A) 0.025 % cream Apply 1 application  topically at bedtime. 05/27/21  Yes [provider]    Family History Family History  Problem Relation Age of Onset   Multiple sclerosis Mother    Healthy Father     Social History Social History   Tobacco Use   Smoking status: Never    Passive exposure: Never   Smokeless tobacco: Never  Vaping Use   Vaping status: Never Used  Substance Use Topics   Alcohol use: No   Drug use: No     Allergies   Patient has no known allergies.   Review of Systems Review of Systems Per HPI  Physical Exam Triage Vital Signs ED Triage Vitals  Encounter Vitals Group     BP 10/28/22 1839 126/74     Systolic BP Percentile --      Diastolic BP Percentile --      Pulse Rate 10/28/22 1839 71     Resp 10/28/22 1839 16     Temp 10/28/22 1839 (!) 97.4 F (36.3 C)     Temp Source 10/28/22 1839 Oral     SpO2 10/28/22 1839  97 %     Weight 10/28/22 1841 190 lb (86.2 kg)     Height 10/28/22 1841 6\' 1"  (1.854 m)     Head Circumference --      Peak Flow --      Pain Score 10/28/22 1841 0     Pain Loc --      Pain Education --      Exclude from Growth Chart --    No data found.  Updated Vital Signs BP 126/74 (BP Location: Right Arm)   Pulse 71   Temp (!) 97.4 F (36.3 C) (Oral)   Resp 16   Ht 6\' 1"  (1.854 m)   Wt 190 lb (86.2 kg)   SpO2 97%   BMI 25.07 kg/m   Visual Acuity Right Eye Distance:   Left Eye Distance:   Bilateral Distance:    Right Eye Near:   Left Eye Near:    Bilateral Near:     Physical Exam Vitals and nursing note reviewed.  Constitutional:      Appearance: He is not ill-appearing or toxic-appearing.  HENT:     Head: Normocephalic and atraumatic.     Right Ear: Hearing and external ear  normal.     Left Ear: Hearing and external ear normal.     Nose: Nose normal.     Mouth/Throat:     Lips: Pink.  Eyes:     General: Lids are normal. Vision grossly intact. Gaze aligned appropriately.     Extraocular Movements: Extraocular movements intact.     Conjunctiva/sclera: Conjunctivae normal.  Pulmonary:     Effort: Pulmonary effort is normal.  Musculoskeletal:     Right hand: Normal.     Left hand: Laceration present. No swelling, deformity, tenderness or bony tenderness. Normal range of motion. Normal strength. Normal sensation. There is no disruption of two-point discrimination. Normal capillary refill. Normal pulse.       Hands:     Cervical back: Neck supple.  Skin:    General: Skin is warm and dry.     Capillary Refill: Capillary refill takes less than 2 seconds.     Findings: No rash.  Neurological:     General: No focal deficit present.     Mental Status: He is alert and oriented to person, place, and time. Mental status is at baseline.     Cranial Nerves: No dysarthria or facial asymmetry.  Psychiatric:        Mood and Affect: Mood normal.        Speech: Speech normal.        Behavior: Behavior normal.        Thought Content: Thought content normal.        Judgment: Judgment normal.      UC Treatments / Results  Labs (all labs ordered are listed, but only abnormal results are displayed) Labs Reviewed - No data to display  EKG   Radiology No results found.  Procedures Procedures (including critical care time)  Medications Ordered in UC Medications - No data to display  Initial Impression / Assessment and Plan / UC Course  I have reviewed the triage vital signs and the nursing notes.  Pertinent labs & imaging results that were available during my care of the patient were reviewed by me and considered in my medical decision making (see chart for details).   1. Laceration of the left little finger without foreign body without damage to  nail Laceration superficial, non-bleeding, without signs of  infection, and without damage to nail. No indication for imaging of the left hand based on stable MSK findings and low suspicion for acute bony abnormality. Tdap up to date. Will defer laceration repair given superficial nature of laceration and delayed presentation. Wound care discussed. Infection return precautions discussed.  Counseled patient on potential for adverse effects with medications prescribed/recommended today, strict ER and return-to-clinic precautions discussed, patient verbalized understanding.    Final Clinical Impressions(s) / UC Diagnoses   Final diagnoses:  Laceration of left little finger without foreign body without damage to nail, initial encounter   Discharge Instructions   None    ED Prescriptions   None    PDMP not reviewed this encounter.   Carlisle Beers, [place] 10/31/22 1002

## 2022-10-28 NOTE — ED Triage Notes (Signed)
Patient states that he cut left 5th finger last night around 9pm on a rusty pipe.  States he believes his last Tdap was within the past 5 years.

## 2022-12-15 ENCOUNTER — Encounter: Payer: Self-pay | Admitting: Orthopedic Surgery

## 2022-12-15 ENCOUNTER — Other Ambulatory Visit (INDEPENDENT_AMBULATORY_CARE_PROVIDER_SITE_OTHER): Payer: 59

## 2022-12-15 ENCOUNTER — Ambulatory Visit (INDEPENDENT_AMBULATORY_CARE_PROVIDER_SITE_OTHER): Payer: 59 | Admitting: Orthopedic Surgery

## 2022-12-15 DIAGNOSIS — M25512 Pain in left shoulder: Secondary | ICD-10-CM

## 2022-12-15 NOTE — Progress Notes (Signed)
Office Visit Note   Patient: Steven Chan           Date of Birth: 1995/04/20           MRN: [id number] Visit Date: 12/15/2022 Requested by: Daisy Floro, MD [address] [place],  [place] [address] PCP: Daisy Floro, MD  Subjective: Chief Complaint  Patient presents with   Left Shoulder - Injury    HPI: Steven Chan is a 27 y.o. male who presents to the office reporting left shoulder instability.  The patient reports dislocation/subluxation while sleeping on 12/10/2022.  He was reaching across his body and he felt like it slipped out at that time.  Does have a history of right shoulder arthroscopy and labral repair both anterior and posterior.  This episode of subluxation/dislocation is the second episode on that left shoulder.  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                ROS: All systems reviewed are negative as they relate to the chief complaint within the history of present illness.  Patient denies fevers or chills.  Assessment & Plan: Visit Diagnoses:  1. Acute pain of left shoulder     Plan: Impression is left shoulder likely posterior instability based on mechanism of injury.  Has happened multiple times over the past several years.  Right shoulder is doing well following arthroscopic stabilization.  He has done extensive physical therapy and rehabilitation on both shoulders after his prior surgery.  Plan at this time is MRI arthrogram of the left shoulder to evaluate posterior labral pathology.  Follow-Up Instructions: No follow-ups on file.   Orders:  Orders Placed This Encounter  Procedures   XR Shoulder Left   MR Shoulder Left w/ contrast   Arthrogram   No orders of the defined types were placed in this encounter.     Procedures: No procedures performed   Clinical Data: No additional findings.  Objective: Vital Signs: There were no vitals taken for this visit.  Physical Exam:  Constitutional: Patient appears well-developed HEENT:   Head: Normocephalic Eyes:EOM are normal Neck: Normal range of motion Cardiovascular: Normal rate Pulmonary/chest: Effort normal Neurologic: Patient is alert Skin: Skin is warm Psychiatric: Patient has normal mood and affect  Ortho Exam: Ortho exam demonstrates on the left-hand side increased posterior laxity compared to anterior laxity.  Does have some positive apprehension with posterior force.  Less than a centimeter sulcus sign.  Rotator cuff strength intact infraspinatus supraspinatus and subscap muscle testing.  O'Brien's testing is equivocal on the left.  No discrete AC joint tenderness on the left.  Range of motion is 70/120/180.  No coarse grinding or popping with labral load testing.  Specialty Comments:  No specialty comments available.  Imaging: XR Shoulder Left  Result Date: 12/15/2022 AP axillary outlet radiographs left shoulder reviewed.  Shoulder is located.  No Hill-Sachs or Bankart lesion visualized.  Acromiohumeral distance intact.  Visualized lung fields clear.    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    Past Medical History:  Diagnosis Date   Anxiety    COVID 07/2019   moderate case - 2 weeks with symptoms   Depression    Seasonal allergies     Family History  Problem Relation Age of Onset   Multiple sclerosis Mother    Healthy Father     Past Surgical History:  Procedure  Laterality Date   MYRINGOTOMY     SHOULDER ARTHROSCOPY WITH LABRAL REPAIR Right 10/20/2021   Procedure: RIGHT SHOULDER POSTERIOR LABRAL TEAR ARTHROSCOPIC;  Surgeon: Cammy Copa, MD;  Location: [facility] OR;  Service: Orthopedics;  Laterality: Right;   WISDOM TOOTH EXTRACTION     Social History   Occupational History   Not on file  Tobacco Use   Smoking status: Never    Passive exposure: Never   Smokeless tobacco: Never  Vaping Use   Vaping status: Never Used  Substance and  Sexual Activity   Alcohol use: No   Drug use: No   Sexual activity: Not on file

## 2022-12-18 ENCOUNTER — Encounter: Payer: Self-pay | Admitting: Orthopedic Surgery

## 2023-01-03 ENCOUNTER — Inpatient Hospital Stay: Admission: RE | Admit: 2023-01-03 | Payer: 59 | Source: Ambulatory Visit

## 2023-01-03 ENCOUNTER — Other Ambulatory Visit: Payer: 59

## 2023-01-10 ENCOUNTER — Ambulatory Visit
Admission: RE | Admit: 2023-01-10 | Discharge: 2023-01-10 | Disposition: A | Discharge status: Home / Self Care | Payer: 59 | Source: Ambulatory Visit | Attending: Orthopedic Surgery | Admitting: Orthopedic Surgery

## 2023-01-10 DIAGNOSIS — M25512 Pain in left shoulder: Secondary | ICD-10-CM

## 2023-01-10 MED ORDER — IOPAMIDOL (ISOVUE-M 200) INJECTION 41%
12.0000 mL | Freq: Once | INTRAMUSCULAR | Status: AC
  Administered 2023-01-10: 12 mL via INTRA_ARTICULAR

## 2023-01-28 NOTE — Progress Notes (Signed)
I called.  He does not need follow-up.  He is probably can want to get this fixed.  Does have a little bit of a patulous capsule in the back but no Bankart reverse lesion like he had on the right-hand side.  We can wait this out for now and he will call when he wants to come in.  If he has follow-up please cancel it thanks

## 2023-03-16 ENCOUNTER — Ambulatory Visit
Admission: EM | Admit: 2023-03-16 | Discharge: 2023-03-16 | Disposition: A | Discharge status: Home / Self Care | Payer: 59 | Attending: Family Medicine | Admitting: Family Medicine

## 2023-03-16 DIAGNOSIS — R457 State of emotional shock and stress, unspecified: Secondary | ICD-10-CM

## 2023-03-16 DIAGNOSIS — K2900 Acute gastritis without bleeding: Secondary | ICD-10-CM

## 2023-03-16 MED ORDER — CITALOPRAM HYDROBROMIDE 20 MG PO TABS: 20.0000 mg | ORAL_TABLET | Freq: Every day | ORAL | 1 refills | Status: DC

## 2023-03-16 MED ORDER — OMEPRAZOLE 20 MG PO CPDR: 20.0000 mg | DELAYED_RELEASE_CAPSULE | Freq: Two times a day (BID) | ORAL | 1 refills | Status: AC

## 2023-03-16 NOTE — ED Provider Notes (Signed)
 [facility]    CSN: [id number] Arrival date & time: 03/16/23  1726      History   Chief Complaint Chief Complaint  Patient presents with   Abdominal Pain    epigastric    HPI Steven Chan is a 28 y.o. male.   HPI  Patient is here for evaluation of epigastric pain.  He is under a tremendous amount of stress at his work and he has developed more more pain in his epigastrium.  He unfortunately went off of his citalopram  and realized that he needs to be back on this.  He takes it for chronic anxiety.  He does not smoke.  No ibuprofen or NSAID.  He does eat a lot of fried food but always does.  He has lost 10 pounds.  His appetite is poor.  He has had some decreased appetite but no nausea vomiting.  No diarrhea.  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 Authorizing Provider  citalopram  (CELEXA ) 20 MG tablet Take 1 tablet (20 mg total) by mouth daily. 03/16/23  Yes Maranda Jamee Jacob, MD  omeprazole  (PRILOSEC) 20 MG capsule Take 1 capsule (20 mg total) by mouth 2 (two) times daily before a meal. 03/16/23  Yes Maranda Jamee Jacob, MD  ascorbic acid (VITAMIN C) 250 MG CHEW 1 tablet Orally Once a day    [provider]  b complex vitamins capsule Take 1 capsule by mouth daily.    [provider]   cholecalciferol (VITAMIN D3) 25 MCG (1000 UNIT) tablet Take 1,000 Units by mouth daily.    [provider]  citalopram  (CELEXA ) 20 MG tablet Take 20 mg by mouth daily. 08/25/21   [provider]  clindamycin (CLEOCIN T) 1 % lotion Apply 1 Application topically every morning. 05/27/21   [provider]  Fexofenadine HCl (ALLEGRA PO) Take by mouth.    [provider]  fluticasone  (FLONASE ) 50 MCG/ACT nasal spray Place 1 spray into both nostrils in the morning and at bedtime. 04/12/22   Crain, Whitney L, PA  tretinoin (RETIN-A) 0.025 % cream Apply 1 application  topically at bedtime. 05/27/21   [provider]    Family History Family History  Problem Relation Age of Onset   Multiple sclerosis Mother    Healthy Father     Social History Social History   Tobacco Use   Smoking status: Never    Passive exposure: Never   Smokeless tobacco: Never  Vaping Use   Vaping status: Never Used  Substance Use Topics   Alcohol use: No   Drug use: No     Allergies   Patient has no known allergies.   Review of Systems Review of Systems  See  HPI Physical Exam Triage Vital Signs ED Triage Vitals  Encounter Vitals Group     BP 03/16/23 1732 131/80     Systolic BP Percentile --      Diastolic BP Percentile --      Pulse Rate 03/16/23 1732 62     Resp 03/16/23 1732 17     Temp 03/16/23 1732 97.7 F (36.5 C)     Temp Source 03/16/23 1732 Oral     SpO2 03/16/23 1732 97 %     Weight --      Height --      Head Circumference --      Peak Flow --      Pain Score 03/16/23 1734 3     Pain Loc --      Pain Education --      Exclude from Growth Chart --    No data found.  Updated Vital Signs BP 131/80 (BP Location: Right Arm)   Pulse 62   Temp 97.7 F (36.5 C) (Oral)   Resp 17   SpO2 97%       Physical Exam Constitutional:      General: He is not in acute distress.    Appearance: He is well-developed.  HENT:     Head: Normocephalic  and atraumatic.  Eyes:     Conjunctiva/sclera: Conjunctivae normal.     Pupils: Pupils are equal, round, and reactive to light.  Cardiovascular:     Rate and Rhythm: Normal rate.     Heart sounds: Normal heart sounds.  Pulmonary:     Effort: Pulmonary effort is normal. No respiratory distress.     Breath sounds: Normal breath sounds.  Abdominal:     General: Abdomen is scaphoid. Bowel sounds are normal. There is no distension.     Palpations: Abdomen is soft.     Tenderness: There is no right CVA tenderness, left CVA tenderness, guarding or rebound. Negative signs include Murphy's sign.  Musculoskeletal:        General: Normal range of motion.     Cervical back: Normal range of motion.  Skin:    General: Skin is warm and dry.  Neurological:     Mental Status: He is alert.      UC Treatments / Results  Labs (all labs ordered are listed, but only abnormal results are displayed) Labs Reviewed - No data to display  EKG   Radiology No results found.  Procedures Procedures (including critical care time)  Medications Ordered in UC Medications - No data to display  Initial Impression / Assessment and Plan / UC Course  I have reviewed the triage vital signs and the nursing notes.  Pertinent labs & imaging results that were available during my care of the patient were reviewed by me and considered in my medical decision making (see chart for details).     Patient has epigastric pain.  I feel like he has a gastritis and GERD that is likely from his emotional stress.  He is trying to find a new job.  We discussed diet.  I will place him on omeprazole .  He will follow-up with his PCP Final Clinical Impressions(s) / UC Diagnoses   Final diagnoses:  Acute gastritis, presence of bleeding unspecified, unspecified gastritis type  Under emotional stress     Discharge Instructions      Take citalopram  1 pill a day Try to take it every day Take omeprazole  2 times a day for 2  to 4  weeks.  Then reduce to 1 pill a day.  As your stomach pain improves you may take it as needed Follow-up with your primary care doctor I agree with your decision to find another job   ED Prescriptions     Medication Sig Dispense Auth. Provider   citalopram  (CELEXA ) 20 MG tablet Take 1 tablet (20 mg total) by mouth daily. 30 tablet Maranda Jamee Jacob, MD   omeprazole  (PRILOSEC) 20 MG capsule Take 1 capsule (20 mg total) by mouth 2 (two) times daily before a meal. 60 capsule Maranda Jamee Jacob, MD      PDMP not reviewed this encounter.   Maranda Jamee Jacob, MD 03/16/23 [phone]

## 2023-03-16 NOTE — ED Triage Notes (Signed)
 Pt c/o epigastric pain x 10 days. Denies N/V/D. No OTC meds tried. No hx of GI issues.

## 2023-03-16 NOTE — Discharge Instructions (Signed)
 Take citalopram  1 pill a day Try to take it every day Take omeprazole  2 times a day for 2 to 4 weeks.  Then reduce to 1 pill a day.  As your stomach pain improves you may take it as needed Follow-up with your primary care doctor I agree with your decision to find another job

## 2023-04-06 ENCOUNTER — Ambulatory Visit
Admission: EM | Admit: 2023-04-06 | Discharge: 2023-04-06 | Disposition: A | Discharge status: Home / Self Care | Payer: 59 | Attending: Family Medicine | Admitting: Family Medicine

## 2023-04-06 DIAGNOSIS — R0981 Nasal congestion: Secondary | ICD-10-CM | POA: Diagnosis not present

## 2023-04-06 DIAGNOSIS — J0100 Acute maxillary sinusitis, unspecified: Secondary | ICD-10-CM | POA: Diagnosis not present

## 2023-04-06 MED ORDER — PREDNISONE 20 MG PO TABS: ORAL_TABLET | ORAL | 0 refills | Status: DC

## 2023-04-06 MED ORDER — AMOXICILLIN-POT CLAVULANATE 875-125 MG PO TABS: 1.0000 | ORAL_TABLET | Freq: Two times a day (BID) | ORAL | 0 refills | Status: DC

## 2023-04-06 NOTE — ED Triage Notes (Signed)
 Pt presents to uc with co of sore throat  since Sunday. With new onset of runny nose and congestion today. Pt has been taking dayquill

## 2023-04-06 NOTE — ED Provider Notes (Signed)
 [name]    CSN: [id number] Arrival date & time: 04/06/23  1646      History   Chief Complaint Chief Complaint  Patient presents with   Nasal Congestion    HPI Steven Chan is a 28 y.o. male.   HPI 28 year old male presents with sore throat and nasal congestion for 4 days.  PMH significant for anxiety and depression.  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 Authorizing Provider  amoxicillin-clavulanate (AUGMENTIN) 875-125 MG tablet Take 1 tablet by mouth every 12 (twelve) hours. 04/06/23  Yes Trevor Iha, FNP  predniSONE (DELTASONE) 20 MG tablet Take 3 tabs PO daily x 5 days. 04/06/23  Yes Trevor Iha, FNP  ascorbic acid (VITAMIN C) 250 MG CHEW 1 tablet Orally Once a day    [provider]  b complex vitamins capsule Take 1 capsule by mouth daily.    [provider]  cholecalciferol (VITAMIN D3) 25 MCG (1000 UNIT) tablet Take 1,000 Units by mouth daily.    [provider]  citalopram (CELEXA) 20 MG tablet Take 20 mg by mouth daily. 08/25/21   [provider]  citalopram (CELEXA) 20 MG tablet Take 1 tablet (20 mg total) by mouth daily. 03/16/23   Eustace Moore, MD  clindamycin (CLEOCIN T) 1 % lotion Apply 1 Application topically every morning. 05/27/21   [provider]  Fexofenadine HCl (ALLEGRA PO) Take by mouth.    [provider]  fluticasone (FLONASE) 50 MCG/ACT nasal spray Place 1 spray into both nostrils in the morning and at bedtime. 04/12/22   Crain, Whitney L, PA  omeprazole (PRILOSEC) 20 MG capsule Take 1 capsule (20 mg total) by mouth 2 (two) times daily before a meal. 03/16/23   Eustace Moore, MD  tretinoin (RETIN-A) 0.025 % cream Apply 1 application  topically at bedtime. 05/27/21   [provider]    Family History Family History  Problem Relation Age of Onset   Multiple sclerosis Mother    Healthy Father     Social History Social History   Tobacco Use   Smoking status: Never    Passive exposure: Never   Smokeless tobacco: Never  Vaping Use   Vaping status: Never Used  Substance Use Topics   Alcohol use: No   Drug use: No     Allergies   Patient has no known allergies.   Review of Systems Review of Systems  HENT:  Positive for congestion, rhinorrhea, sinus pressure and sore throat.   All other systems reviewed and are negative.    Physical Exam Triage Vital Signs ED Triage Vitals [04/06/23 1719]  Encounter Vitals Group     BP      Systolic BP Percentile  Diastolic BP Percentile      Pulse      Resp      Temp      Temp src      SpO2      Weight      Height      Head Circumference      Peak Flow      Pain Score 0     Pain Loc      Pain Education      Exclude from Growth Chart    No data found.  Updated Vital Signs BP 119/80   Pulse 63   Temp 97.8 F (36.6 C)   Resp 16   SpO2 98%    Physical Exam Vitals and nursing note reviewed.  Constitutional:      General: He is not in acute distress.    Appearance: Normal appearance. He is normal weight. He is not ill-appearing.  HENT:     Head: Normocephalic and atraumatic.     Right Ear: Tympanic membrane and external ear normal.     Left Ear: Tympanic membrane and external ear normal.     Ears:     Comments:  Moderate eustachian tube dysfunction noted bilaterally    Nose: Nose normal.     Mouth/Throat:     Mouth: Mucous membranes are moist.     Pharynx: Oropharynx is clear.  Eyes:     Extraocular Movements: Extraocular movements intact.     Conjunctiva/sclera: Conjunctivae normal.     Pupils: Pupils are equal, round, and reactive to light.  Cardiovascular:     Rate and Rhythm: Normal rate and regular rhythm.     Pulses: Normal pulses.     Heart sounds: Normal heart sounds.  Pulmonary:     Effort: Pulmonary effort is normal.     Breath sounds: Normal breath sounds. No wheezing, rhonchi or rales.  Musculoskeletal:        General: Normal range of motion.     Cervical back: Normal range of motion and neck supple.  Skin:    General: Skin is warm and dry.  Neurological:     General: No focal deficit present.     Mental Status: He is alert and oriented to person, place, and time. Mental status is at baseline.  Psychiatric:        Mood and Affect: Mood normal.        Behavior: Behavior normal.      UC Treatments / Results  Labs (all labs ordered are listed, but only abnormal results are displayed) Labs Reviewed - No data to display  EKG   Radiology No results found.  Procedures Procedures (including critical care time)  Medications Ordered in UC Medications - No data to display  Initial Impression / Assessment and Plan / UC Course  I have reviewed the triage vital signs and the nursing notes.  Pertinent labs & imaging results that were available during my care of the patient were reviewed by me and considered in my medical decision making (see chart for details).     MDM: 1.  Subacute maxillary sinusitis-Rx'd Augmentin 875/125 mg tablet: Take 1 tablet twice daily x 7 days; 2.  Congestion of nasal sinus-Rx'd prednisone 20 mg tablet: Take 3 tabs p.o. daily x 5 days. Advised patient to take medications as directed with food to completion.  Advised patient to take prednisone  with first dose of Augmentin for the next 5 of 7 days.  Encouraged to increase daily  water intake to 64 ounces per day while taking these medications.  Advised if symptoms worsen and/or unresolved please follow-up with PCP or here for further evaluation.  Final Clinical Impressions(s) / UC Diagnoses   Final diagnoses:  Subacute maxillary sinusitis  Congestion of nasal sinus     Discharge Instructions      Advised patient to take medications as directed with food to completion.  Advised patient to take prednisone with first dose of Augmentin for the next 5 of 7 days.  Encouraged to increase daily water intake to 64 ounces per day while taking these medications.  Advised if symptoms worsen and/or unresolved please follow-up with PCP or here for further evaluation.     ED Prescriptions     Medication Sig Dispense Auth. Provider   amoxicillin-clavulanate (AUGMENTIN) 875-125 MG tablet Take 1 tablet by mouth every 12 (twelve) hours. 14 tablet Trevor Iha, FNP   predniSONE (DELTASONE) 20 MG tablet Take 3 tabs PO daily x 5 days. 15 tablet Trevor Iha, FNP      PDMP not reviewed this encounter.   Trevor Iha, FNP 04/06/23 1742

## 2023-04-06 NOTE — Discharge Instructions (Addendum)
 Advised patient to take medications as directed with food to completion.  Advised patient to take prednisone with first dose of Augmentin for the next 5 of 7 days.  Encouraged to increase daily water intake to 64 ounces per day while taking these medications.  Advised if symptoms worsen and/or unresolved please follow-up with PCP or here for further evaluation.

## 2023-06-06 ENCOUNTER — Ambulatory Visit

## 2023-08-28 ENCOUNTER — Ambulatory Visit
Admission: RE | Admit: 2023-08-28 | Discharge: 2023-08-28 | Disposition: A | Discharge status: Home / Self Care | Attending: Family Medicine | Admitting: Family Medicine

## 2023-08-28 VITALS — BP 119/72 | HR 101 | Temp 98.6°F | Resp 18 | Ht 73.0 in | Wt 185.0 lb

## 2023-08-28 DIAGNOSIS — J069 Acute upper respiratory infection, unspecified: Secondary | ICD-10-CM | POA: Diagnosis not present

## 2023-08-28 DIAGNOSIS — H9201 Otalgia, right ear: Secondary | ICD-10-CM

## 2023-08-28 MED ORDER — AMOXICILLIN 875 MG PO TABS: 875.0000 mg | ORAL_TABLET | Freq: Two times a day (BID) | ORAL | 0 refills | Status: DC

## 2023-08-28 NOTE — Discharge Instructions (Signed)
 Drink fluids Take Tylenol  or ibuprofen for pain or fever Use Flonase  daily.  This will help with sinus congestion and will help promote ear drainage This looks like a viral infection although there is fluid behind your right eardrum.  If the ear pain worsens fill and take the antibiotic

## 2023-08-28 NOTE — ED Provider Notes (Signed)
 [facility]    CSN: [id number] Arrival date & time: 08/28/23  1359      History   Chief Complaint Chief Complaint  Patient presents with   Ear Fullness    Ear pain and swollen lymph nodes. Bump on side of jaw. - Entered by patient    HPI Steven Chan is a 28 y.o. male.   HPI Patient is a self-described hypochondriac.  He says that he worries a lot about his health and if he has changes, he becomes worried he might have a serious illness.  He woke up this morning with some fever and chills and noticed that he had swollen glands in his neck.  He also has pressure and pain in his right ear.  He had a lot of ear infections as a child.  He does have some sinus congestion and yellow drainage.  No coughing or chest congestion.  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 Authorizing Provider  amoxicillin  (AMOXIL ) 875 MG tablet Take 1 tablet (875 mg total) by mouth 2 (two) times daily. 08/28/23  Yes Maranda Jamee Jacob, MD  ascorbic acid (VITAMIN C) 250 MG CHEW 1 tablet Orally Once a day   Yes [provider]  b complex vitamins capsule Take 1 capsule by mouth daily.   Yes [provider]  cholecalciferol (VITAMIN D3) 25 MCG (1000 UNIT) tablet Take 1,000 Units by mouth daily.   Yes [provider]   citalopram  (CELEXA ) 20 MG tablet Take 20 mg by mouth daily. 08/25/21  Yes [provider]  clindamycin (CLEOCIN T) 1 % lotion Apply 1 Application topically every morning. 05/27/21  Yes [provider]  Fexofenadine HCl (ALLEGRA PO) Take by mouth.   Yes [provider]  fluticasone  (FLONASE ) 50 MCG/ACT nasal spray Place 1 spray into both nostrils in the morning and at bedtime. 04/12/22  Yes Crain, Whitney L, PA  omeprazole  (PRILOSEC) 20 MG capsule Take 1 capsule (20 mg total) by mouth 2 (two) times daily before a meal. 03/16/23  Yes Maranda Jamee Jacob, MD  tretinoin (RETIN-A) 0.025 % cream Apply 1 application  topically at bedtime. 05/27/21  Yes [provider]    Family History Family History  Problem Relation Age of Onset   Multiple sclerosis Mother    Healthy Father     Social History Social History   Tobacco Use   Smoking status: Never    Passive exposure: Never   Smokeless tobacco: Never  Vaping Use   Vaping status: Never Used  Substance Use Topics   Alcohol use: No   Drug use: No     Allergies   Patient has no known allergies.   Review of Systems Review of Systems See HPI  Physical  Exam Triage Vital Signs ED Triage Vitals  Encounter Vitals Group     BP 08/28/23 1411 119/72     Girls Systolic BP Percentile --      Girls Diastolic BP Percentile --      Boys Systolic BP Percentile --      Boys Diastolic BP Percentile --      Pulse Rate 08/28/23 1411 (!) 101     Resp 08/28/23 1411 18     Temp 08/28/23 1411 98.6 F (37 C)     Temp Source 08/28/23 1411 Oral     SpO2 08/28/23 1411 96 %     Weight 08/28/23 1412 185 lb (83.9 kg)     Height 08/28/23 1412 6' 1 (1.854 m)     Head Circumference --      Peak Flow --      Pain Score 08/28/23 1412 3     Pain Loc --      Pain Education --      Exclude from Growth Chart --    No data found.  Updated Vital Signs BP 119/72 (BP Location: Right Arm)   Pulse (!) 101   Temp 98.6 F (37  C) (Oral)   Resp 18   Ht 6' 1 (1.854 m)   Wt 83.9 kg   SpO2 96%   BMI 24.41 kg/m       Physical Exam Constitutional:      General: He is not in acute distress.    Appearance: He is well-developed and normal weight.  HENT:     Head: Normocephalic and atraumatic.     Left Ear: Tympanic membrane and ear canal normal.     Ears:     Comments: Right TM is bulging slightly injected.    Nose: Rhinorrhea present. No congestion.     Mouth/Throat:     Pharynx: No posterior oropharyngeal erythema.  Eyes:     Conjunctiva/sclera: Conjunctivae normal.     Pupils: Pupils are equal, round, and reactive to light.  Neck:     Comments: Cervical adenopathy left angle of the jaw.  At the right angle of the jaw there is a small inclusion cyst in the skin, mobile, nontender Cardiovascular:     Rate and Rhythm: Normal rate and regular rhythm.     Heart sounds: Normal heart sounds.  Pulmonary:     Effort: Pulmonary effort is normal. No respiratory distress.  Abdominal:     General: There is no distension.     Palpations: Abdomen is soft.  Musculoskeletal:        General: Normal range of motion.     Cervical back: Normal range of motion and neck supple.  Lymphadenopathy:     Cervical: Cervical adenopathy present.  Skin:    General: Skin is warm and dry.  Neurological:     Mental Status: He is alert.      UC Treatments / Results  Labs (all labs ordered are listed, but only abnormal results are displayed) Labs Reviewed - No data to display  EKG   Radiology No results found.  Procedures Procedures (including critical care time)  Medications Ordered in UC Medications - No data to display  Initial Impression / Assessment and Plan / UC Course  I have reviewed the triage vital signs and the nursing notes.  Pertinent labs & imaging results that were available during my care of the patient were reviewed by me and considered in my medical decision making (see chart for details).  I told the patient I believe this is a viral infection.  The ear pressure and pain will likely resolve with Flonase , fluids, and rest.  If it worsens instead of improved some giving him an antibiotic to take in case it is needed.  Return here as needed Final Clinical Impressions(s) / UC Diagnoses   Final diagnoses:  Otalgia, right ear  Viral upper respiratory infection     Discharge Instructions      Drink fluids Take Tylenol  or ibuprofen for pain or fever Use Flonase  daily.  This will help with sinus congestion and will help promote ear drainage This looks like a viral infection although there is fluid behind your right eardrum.  If the ear pain worsens fill and take the antibiotic   ED Prescriptions     Medication Sig Dispense Auth. Provider   amoxicillin  (AMOXIL ) 875 MG tablet Take 1 tablet (875 mg total) by mouth 2 (two) times daily. 14 tablet Maranda Jamee Jacob, MD      PDMP not reviewed this encounter.   Maranda Jamee Jacob, MD 08/28/23 1430

## 2023-08-28 NOTE — ED Triage Notes (Signed)
 Patient states that he awoke this morning w/right ear pain and swollen lymph nodes.  Denies cough.  Patient has not taken any OTC cold meds.

## 2023-09-03 ENCOUNTER — Ambulatory Visit
Admission: RE | Admit: 2023-09-03 | Discharge: 2023-09-03 | Disposition: A | Discharge status: Home / Self Care | Source: Ambulatory Visit | Attending: Family Medicine | Admitting: Family Medicine

## 2023-09-03 ENCOUNTER — Other Ambulatory Visit: Payer: Self-pay

## 2023-09-03 VITALS — BP 119/78 | HR 81 | Temp 98.2°F | Resp 17

## 2023-09-03 DIAGNOSIS — H6691 Otitis media, unspecified, right ear: Secondary | ICD-10-CM | POA: Diagnosis not present

## 2023-09-03 NOTE — ED Provider Notes (Signed)
 [facility]    CSN: [id number] Arrival date & time: 09/03/23  1105      History   Chief Complaint Chief Complaint  Patient presents with   Fatigue   Cough    HPI CONSTANCE HACKENBERG is a 28 y.o. male.   HPI 28 year old male presents with cough and fatigue for 1 week.  Patient reports was evaluated here on 08/28/2023 for otalgia of right ear and was prescribed amoxicillin  which she has not taken as of yet.  Please see epic for this encounter note.  Patient reports he tested positive for COVID-19 the next day 08/29/2023.  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 Authorizing Provider  amoxicillin  (AMOXIL ) 875 MG tablet Take 1 tablet (875 mg total) by mouth 2 (two) times daily. 08/28/23   Maranda Jamee Jacob, MD  ascorbic acid (VITAMIN C) 250 MG CHEW 1 tablet Orally Once a day    [provider]  b complex vitamins capsule Take 1 capsule by mouth daily.    [provider]  cholecalciferol (VITAMIN D3) 25 MCG (1000 UNIT) tablet Take 1,000 Units by mouth daily.    [provider]  citalopram  (CELEXA ) 20 MG tablet Take 20 mg by mouth daily. 08/25/21   [provider]  clindamycin (CLEOCIN T) 1 % lotion Apply 1 Application topically every morning. 05/27/21   [provider]  Fexofenadine HCl  (ALLEGRA PO) Take by mouth.    [provider]  fluticasone  (FLONASE ) 50 MCG/ACT nasal spray Place 1 spray into both nostrils in the morning and at bedtime. 04/12/22   Crain, Whitney L, PA  omeprazole  (PRILOSEC) 20 MG capsule Take 1 capsule (20 mg total) by mouth 2 (two) times daily before a meal. 03/16/23   Maranda Jamee Jacob, MD  tretinoin (RETIN-A) 0.025 % cream Apply 1 application  topically at bedtime. 05/27/21   [provider]    Family History Family History  Problem Relation Age of Onset   Multiple sclerosis Mother    Healthy Father     Social History Social History   Tobacco Use   Smoking status: Never    Passive exposure: Never   Smokeless tobacco: Never  Vaping Use   Vaping status: Never Used  Substance Use Topics   Alcohol use: No   Drug use: No     Allergies   Patient has no known allergies.   Review of Systems Review of Systems  Constitutional:  Positive for fatigue.  HENT:  Positive for ear pain.   Respiratory:  Positive for cough.   All other systems reviewed and are negative.    Physical Exam Triage Vital Signs ED Triage Vitals  Encounter Vitals Group     BP  Girls Systolic BP Percentile      Girls Diastolic BP Percentile      Boys Systolic BP Percentile      Boys Diastolic BP Percentile      Pulse      Resp      Temp      Temp src      SpO2      Weight      Height      Head Circumference      Peak Flow      Pain Score      Pain Loc      Pain Education      Exclude from Growth Chart    No data found.  Updated Vital Signs BP 119/78 (BP Location: Right Arm)   Pulse 81   Temp 98.2 F (36.8 C) (Oral)   Resp 17   SpO2 96%    Physical Exam Vitals and nursing note reviewed.  Constitutional:      Appearance: Normal appearance. He is normal weight.  HENT:     Head: Normocephalic and atraumatic.     Right Ear: Ear canal and external ear normal.     Left Ear: Tympanic membrane, ear canal and external ear normal.      Ears:     Comments: Right TM: Erythematous, bulging    Mouth/Throat:     Mouth: Mucous membranes are moist.     Pharynx: Oropharynx is clear.  Eyes:     Extraocular Movements: Extraocular movements intact.     Conjunctiva/sclera: Conjunctivae normal.     Pupils: Pupils are equal, round, and reactive to light.  Cardiovascular:     Rate and Rhythm: Normal rate and regular rhythm.     Pulses: Normal pulses.     Heart sounds: Normal heart sounds.  Pulmonary:     Effort: Pulmonary effort is normal.     Breath sounds: Normal breath sounds. No wheezing, rhonchi or rales.  Musculoskeletal:        General: Normal range of motion.  Skin:    General: Skin is warm and dry.  Neurological:     General: No focal deficit present.     Mental Status: He is alert and oriented to person, place, and time. Mental status is at baseline.  Psychiatric:        Mood and Affect: Mood normal.        Behavior: Behavior normal.      UC Treatments / Results  Labs (all labs ordered are listed, but only abnormal results are displayed) Labs Reviewed - No data to display  EKG   Radiology No results found.  Procedures Procedures (including critical care time)  Medications Ordered in UC Medications - No data to display  Initial Impression / Assessment and Plan / UC Course  I have reviewed the triage vital signs and the nursing notes.  Pertinent labs & imaging results that were available during my care of the patient were reviewed by me and considered in my medical decision making (see chart for details).     MDM: 1.  Acute right otitis media-Advised patient take medication (amoxicillin  875 mg tablet) as previously prescribed for right ear infection.  Encouraged increase daily water intake to 64 ounces per day while taking this medication.  Advised if symptoms worsen and/or unresolved please follow-up with your PCP, ENT, or here for further evaluation.  Patient discharged home, hemodynamically  stable. Final Clinical Impressions(s) / UC Diagnoses   Final diagnoses:  Acute right otitis media     Discharge Instructions      Advised patient take medication (amoxicillin  875 mg tablet) as previously prescribed for right ear infection.  Encouraged increase daily water intake to 64 ounces per day while taking this medication.  Advised if symptoms worsen and/or unresolved please follow-up with your PCP, ENT, or here for further evaluation.     ED Prescriptions   None    PDMP not reviewed this encounter.   Teddy Sharper, FNP 09/03/23 1138

## 2023-09-03 NOTE — Discharge Instructions (Addendum)
 Advised patient take medication (amoxicillin  875 mg tablet) as previously prescribed for right ear infection.  Encouraged increase daily water intake to 64 ounces per day while taking this medication.  Advised if symptoms worsen and/or unresolved please follow-up with your PCP, ENT, or here for further evaluation.

## 2023-09-03 NOTE — ED Triage Notes (Signed)
 Pt here today for fatigue and continued cough. Was seen in UC 08/28/23, dx with URI. Says he tested pos for covid the following day. No OTC meds taken.

## 2023-12-12 ENCOUNTER — Encounter: Payer: Self-pay | Admitting: Radiology

## 2024-01-29 ENCOUNTER — Ambulatory Visit
Admission: RE | Admit: 2024-01-29 | Discharge: 2024-01-29 | Disposition: A | Discharge status: Home / Self Care | Source: Ambulatory Visit | Attending: Family Medicine | Admitting: Family Medicine

## 2024-01-29 ENCOUNTER — Other Ambulatory Visit: Payer: Self-pay

## 2024-01-29 VITALS — BP 124/78 | HR 82 | Temp 98.0°F | Resp 20 | Ht 73.0 in | Wt 185.0 lb

## 2024-01-29 DIAGNOSIS — J0100 Acute maxillary sinusitis, unspecified: Secondary | ICD-10-CM | POA: Diagnosis not present

## 2024-01-29 DIAGNOSIS — R0981 Nasal congestion: Secondary | ICD-10-CM | POA: Diagnosis not present

## 2024-01-29 DIAGNOSIS — J309 Allergic rhinitis, unspecified: Secondary | ICD-10-CM

## 2024-01-29 MED ORDER — FEXOFENADINE HCL 180 MG PO TABS: 180.0000 mg | ORAL_TABLET | Freq: Every day | ORAL | 0 refills | Status: AC

## 2024-01-29 MED ORDER — PREDNISONE 20 MG PO TABS: ORAL_TABLET | ORAL | 0 refills | Status: AC

## 2024-01-29 MED ORDER — AMOXICILLIN-POT CLAVULANATE 875-125 MG PO TABS: 1.0000 | ORAL_TABLET | Freq: Two times a day (BID) | ORAL | 0 refills | Status: AC

## 2024-01-29 NOTE — Discharge Instructions (Addendum)
 Advised patient take medications as directed with food to completion.  Advised to take prednisone  and Allegra  with first dose of Augmentin  for the next 5 of 7 days.  Advised may use Allegra  as needed afterwards for concurrent postnasal drainage/drip.  Encouraged increase daily water intake to 64 ounces per day while taking these medications.  Advised if symptoms worsen and/or unresolved please follow-up with PCP or here for further evaluation.

## 2024-01-29 NOTE — ED Provider Notes (Signed)
 " [facility]    CSN: [id number] Arrival date & time: 01/29/24  1441      History   Chief Complaint Chief Complaint  Patient presents with   Nasal Congestion    Around someone with a sinus infection, now have the same symptoms. - Entered by patient    HPI Steven Chan is a 28 y.o. male.   HPI 28 year old male presents with sinus nasal congestion, with postnasal drip and sinus pressure for 5 days.  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 Authorizing Provider  amoxicillin -clavulanate (AUGMENTIN ) 875-125 MG tablet Take 1 tablet by mouth every 12 (twelve) hours. 01/29/24  Yes Teddy Sharper, FNP  fexofenadine  (ALLEGRA  ALLERGY) 180 MG tablet Take 1 tablet (180 mg total) by mouth daily. 01/29/24 02/13/24 Yes Teddy Sharper, FNP  predniSONE  (DELTASONE ) 20 MG tablet Take 3 tabs PO daily x 5 days. 01/29/24  Yes Teddy Sharper, FNP  ascorbic acid (VITAMIN C) 250 MG CHEW 1 tablet Orally Once a day    [provider]  b complex vitamins capsule Take 1 capsule by mouth daily.    [provider]  cholecalciferol (VITAMIN D3) 25 MCG (1000 UNIT) tablet Take 1,000 Units by mouth daily.    [provider]  citalopram  (CELEXA ) 20 MG tablet Take 20 mg  by mouth daily. 08/25/21   [provider]  clindamycin (CLEOCIN T) 1 % lotion Apply 1 Application topically every morning. 05/27/21   [provider]  fluticasone  (FLONASE ) 50 MCG/ACT nasal spray Place 1 spray into both nostrils in the morning and at bedtime. 04/12/22   Crain, Benton L, PA  omeprazole  (PRILOSEC) 20 MG capsule Take 1 capsule (20 mg total) by mouth 2 (two) times daily before a meal. 03/16/23   Maranda Jamee Jacob, MD  tretinoin (RETIN-A) 0.025 % cream Apply 1 application  topically at bedtime. 05/27/21   [provider]    Family History Family History  Problem Relation Age of Onset   Multiple sclerosis Mother    Healthy Father     Social History Social History[1]   Allergies   Patient has no known allergies.   Review of Systems Review of Systems   Physical Exam Triage Vital Signs ED Triage Vitals  Encounter Vitals Group     BP      Girls Systolic BP Percentile      Girls Diastolic BP Percentile      Boys Systolic BP Percentile      Boys Diastolic BP Percentile      Pulse      Resp      Temp      Temp src      SpO2  Weight      Height      Head Circumference      Peak Flow      Pain Score      Pain Loc      Pain Education      Exclude from Growth Chart    No data found.  Updated Vital Signs BP 124/78 (BP Location: Right Arm)   Pulse 82   Temp 98 F (36.7 C) (Oral)   Resp 20   Ht 6' 1 (1.854 m)   Wt 185 lb (83.9 kg)   SpO2 97%   BMI 24.41 kg/m   Visual Acuity Right Eye Distance:   Left Eye Distance:   Bilateral Distance:    Right Eye Near:   Left Eye Near:    Bilateral Near:     Physical Exam Vitals and nursing note reviewed.  Constitutional:      General: He is not in acute distress.    Appearance: Normal appearance. He is normal weight. He is not ill-appearing.  HENT:     Head: Normocephalic and atraumatic.     Right Ear: Tympanic membrane and external ear normal.     Left Ear: Tympanic membrane  and external ear normal.     Ears:     Comments: Significant eustachian tube dysfunction noted bilaterally    Nose:     Right Sinus: Maxillary sinus tenderness present.     Left Sinus: Maxillary sinus tenderness present.     Mouth/Throat:     Mouth: Mucous membranes are moist.     Pharynx: Oropharynx is clear.     Comments: Significant amount of clear drainage of posterior oropharynx noted Eyes:     Extraocular Movements: Extraocular movements intact.     Conjunctiva/sclera: Conjunctivae normal.     Pupils: Pupils are equal, round, and reactive to light.  Cardiovascular:     Rate and Rhythm: Normal rate and regular rhythm.     Heart sounds: Normal heart sounds.  Pulmonary:     Effort: Pulmonary effort is normal.     Breath sounds: Normal breath sounds. No wheezing, rhonchi or rales.  Musculoskeletal:        General: Normal range of motion.  Skin:    General: Skin is warm and dry.  Neurological:     General: No focal deficit present.     Mental Status: He is alert and oriented to person, place, and time.  Psychiatric:        Mood and Affect: Mood normal.        Behavior: Behavior normal.      UC Treatments / Results  Labs (all labs ordered are listed, but only abnormal results are displayed) Labs Reviewed - No data to display  EKG   Radiology No results found.  Procedures Procedures (including critical care time)  Medications Ordered in UC Medications - No data to display  Initial Impression / Assessment and Plan / UC Course  I have reviewed the triage vital signs and the nursing notes.  Pertinent labs & imaging results that were available during my care of the patient were reviewed by me and considered in my medical decision making (see chart for details).     MDM: 1.  Acute nonrecurrent maxillary sinusitis-Rx'd Augmentin  875/125 mg tablet: Take 1 tablet twice daily x 7 days; 2.  Congestion of nasal sinus-Rx'd prednisone  20 mg tablet: Take 3 tablets p.o.  daily x 5 days; 3.  Allergic rhinitis, unspecified seasonality, unspecified trigger-Rx'd Allegra   180 mg tablet: Take 1 tablet daily x 5 days, then as needed. Advised patient take medications as directed with food to completion.  Advised to take prednisone  and Allegra  with first dose of Augmentin  for the next 5 of 7 days.  Advised may use Allegra  as needed afterwards for concurrent postnasal drainage/drip.  Encouraged increase daily water intake to 64 ounces per day while taking these medications.  Advised if symptoms worsen and/or unresolved please follow-up with PCP or here for further evaluation.  Discharged home, hemodynamically stable. Final Clinical Impressions(s) / UC Diagnoses   Final diagnoses:  Acute non-recurrent maxillary sinusitis  Congestion of nasal sinus  Allergic rhinitis, unspecified seasonality, unspecified trigger     Discharge Instructions      Advised patient take medications as directed with food to completion.  Advised to take prednisone  and Allegra  with first dose of Augmentin  for the next 5 of 7 days.  Advised may use Allegra  as needed afterwards for concurrent postnasal drainage/drip.  Encouraged increase daily water intake to 64 ounces per day while taking these medications.  Advised if symptoms worsen and/or unresolved please follow-up with PCP or here for further evaluation.     ED Prescriptions     Medication Sig Dispense Auth. Provider   amoxicillin -clavulanate (AUGMENTIN ) 875-125 MG tablet Take 1 tablet by mouth every 12 (twelve) hours. 14 tablet Caridad Silveira, FNP   predniSONE  (DELTASONE ) 20 MG tablet Take 3 tabs PO daily x 5 days. 15 tablet Draken Farrior, FNP   fexofenadine  (ALLEGRA  ALLERGY) 180 MG tablet Take 1 tablet (180 mg total) by mouth daily. 15 tablet Keyden Pavlov, FNP      PDMP not reviewed this encounter.    [1]  Social History Tobacco Use   Smoking status: Never    Passive exposure: Never   Smokeless tobacco: Never  Vaping Use    Vaping status: Never Used  Substance Use Topics   Alcohol use: No   Drug use: No     Teddy Sharper, FNP 01/29/24 1610  "

## 2024-01-29 NOTE — ED Triage Notes (Addendum)
 Pt presenting with c/o nasal congestion, post nasal drip, sinus pressure and dark yellow mucus from nares  and runny nose x 5  days. Pt stated that he used saline spray which was ineffective.
